# Patient Record
Sex: Male | Born: 1940
Health system: Southern US, Community
[De-identification: ages and names within clinical notes are randomized; demographics above are authoritative.]

## PROBLEM LIST (undated history)

## (undated) DIAGNOSIS — K8020 Calculus of gallbladder without cholecystitis without obstruction: Secondary | ICD-10-CM

## (undated) DIAGNOSIS — I951 Orthostatic hypotension: Secondary | ICD-10-CM

## (undated) DIAGNOSIS — R002 Palpitations: Secondary | ICD-10-CM

## (undated) DIAGNOSIS — I4891 Unspecified atrial fibrillation: Secondary | ICD-10-CM

## (undated) DIAGNOSIS — N400 Enlarged prostate without lower urinary tract symptoms: Secondary | ICD-10-CM

## (undated) HISTORY — PX: ROTATOR CUFF REPAIR: SHX139

## (undated) HISTORY — DX: Palpitations: R00.2

## (undated) HISTORY — DX: Benign prostatic hyperplasia without lower urinary tract symptoms: N40.0

## (undated) HISTORY — DX: Unspecified atrial fibrillation: I48.91

## (undated) HISTORY — PX: HEMORROIDECTOMY: SUR656

## (undated) HISTORY — DX: Calculus of gallbladder without cholecystitis without obstruction: K80.20

## (undated) HISTORY — DX: Orthostatic hypotension: I95.1

---

## 2016-05-20 ENCOUNTER — Ambulatory Visit (INDEPENDENT_AMBULATORY_CARE_PROVIDER_SITE_OTHER): Payer: Medicare Other | Admitting: Sports Medicine

## 2016-05-20 ENCOUNTER — Encounter: Payer: Self-pay | Admitting: Sports Medicine

## 2016-05-20 ENCOUNTER — Encounter (INDEPENDENT_AMBULATORY_CARE_PROVIDER_SITE_OTHER): Payer: Self-pay

## 2016-05-20 VITALS — BP 128/73 | HR 55 | Ht 75.0 in | Wt 180.0 lb

## 2016-05-20 DIAGNOSIS — L603 Nail dystrophy: Secondary | ICD-10-CM | POA: Diagnosis not present

## 2016-05-20 DIAGNOSIS — M79675 Pain in left toe(s): Secondary | ICD-10-CM

## 2016-05-20 NOTE — Progress Notes (Signed)
Subjective: Joel Pope is a 75 y.o. male patient presents to office today complaining of nail changes at left first toe some time ago. He was wearing a bad pair of shoes and noticed that around the nail started to become red. States that a while back saw discharge, but this is no longer an issue. However, wanted to have his nail checked. Denies any current pain. Patient denies fever/chills/nausea/vomitting/any other related constitutional symptoms at this time.  There are no active problems to display for this patient.   No current outpatient prescriptions on file prior to visit.   No current facility-administered medications on file prior to visit.     No Known Allergies  Objective:  Vitals:   05/20/16 1007  Weight: 180 lb (81.6 kg)  Height: 6\' 3"  (1.905 m)    General: Well developed, nourished, in no acute distress, alert and oriented x3   Dermatology: Skin is warm, dry and supple bilateral. Left hallux nail has no acute ingrowing distally well attached possible proximal lysis, however, no acute signs of infection. (-) Erythema. (-) Edema. (-) serosanguous  drainage present. The remaining nails appear unremarkable at this time. There are no open sores, lesions or other signs of infection present.  Vascular: Dorsalis Pedis artery and Posterior Tibial artery pedal pulses are 1/4 bilateral with immedate capillary fill time. Scant hair growth present. Varicosities bilateral. No lower extremity edema.   Neruologic: Grossly intact via light touch bilateral.  Musculoskeletal: No tenderness to palpation of the left hallux nail bed or nail fold(s). Muscular strength within normal limits in all groups bilateral.   Assesement and Plan: Problem List Items Addressed This Visit    None    Visit Diagnoses    Nail dystrophy    -  Primary   Toe pain, left          -Discussed treatment alternatives and plan of care; ExplainedTreatment options including permanent/temporary nail avulsion  and post procedure course to patient. -Patient declined nail procedure at this time and states would like to closely monitor the nail since it does not bother him -Advised patient to soak as needed with Epsom salt and apply Neosporin around the nail fold or cuticle if he notices change, redness or signs of infection and return to office immediately -Recommend good supportive shoes with an toe box to prevent irritation to left hallux -Dispensed silicone toe protector to use as needed at Left hallux -Patient is to return as needed or sooner if problems arise.  Landis Martins, DPM

## 2016-05-20 NOTE — Patient Instructions (Addendum)
Epsom salt soaks as needed  Neosporin around cuticle if starts to change or get infected return to office

## 2016-10-04 DIAGNOSIS — R972 Elevated prostate specific antigen [PSA]: Secondary | ICD-10-CM | POA: Diagnosis not present

## 2016-10-26 DIAGNOSIS — Z125 Encounter for screening for malignant neoplasm of prostate: Secondary | ICD-10-CM | POA: Diagnosis not present

## 2016-10-26 DIAGNOSIS — R3912 Poor urinary stream: Secondary | ICD-10-CM | POA: Diagnosis not present

## 2017-03-07 DIAGNOSIS — Z6824 Body mass index (BMI) 24.0-24.9, adult: Secondary | ICD-10-CM | POA: Diagnosis not present

## 2017-03-07 DIAGNOSIS — L255 Unspecified contact dermatitis due to plants, except food: Secondary | ICD-10-CM | POA: Diagnosis not present

## 2017-05-04 DIAGNOSIS — M720 Palmar fascial fibromatosis [Dupuytren]: Secondary | ICD-10-CM | POA: Diagnosis not present

## 2017-05-09 ENCOUNTER — Encounter: Payer: Self-pay | Admitting: Cardiology

## 2017-05-09 ENCOUNTER — Ambulatory Visit (INDEPENDENT_AMBULATORY_CARE_PROVIDER_SITE_OTHER): Payer: PPO | Admitting: Cardiology

## 2017-05-09 DIAGNOSIS — R002 Palpitations: Secondary | ICD-10-CM | POA: Diagnosis not present

## 2017-05-09 DIAGNOSIS — N401 Enlarged prostate with lower urinary tract symptoms: Secondary | ICD-10-CM

## 2017-05-09 DIAGNOSIS — R350 Frequency of micturition: Secondary | ICD-10-CM | POA: Diagnosis not present

## 2017-05-09 DIAGNOSIS — I951 Orthostatic hypotension: Secondary | ICD-10-CM | POA: Diagnosis not present

## 2017-05-09 DIAGNOSIS — N400 Enlarged prostate without lower urinary tract symptoms: Secondary | ICD-10-CM

## 2017-05-09 HISTORY — DX: Benign prostatic hyperplasia without lower urinary tract symptoms: N40.0

## 2017-05-09 HISTORY — DX: Orthostatic hypotension: I95.1

## 2017-05-09 HISTORY — DX: Palpitations: R00.2

## 2017-05-09 NOTE — Patient Instructions (Addendum)
Medication Instructions:  Your physician recommends that you continue on your current medications as directed. Please refer to the Current Medication list given to you today.  Labwork: None ordered  Testing/Procedures: Your physician has requested that you have an echocardiogram. Echocardiography is a painless test that uses sound waves to create images of your heart. It provides your doctor with information about the size and shape of your heart and how well your heart's chambers and valves are working. This procedure takes approximately one hour. There are no restrictions for this procedure.  We will contact you with your appointment at [facility]  EKG today in office  Follow-Up: Your physician recommends that you schedule a follow-up appointment in: 3 month with Dr. Agustin Cree   Any Other Special Instructions Will Be Listed Below (If Applicable).     If you need a refill on your cardiac medications before your next appointment, please call your pharmacy.

## 2017-05-09 NOTE — Progress Notes (Signed)
Cardiology Consultation:    Date:  05/09/2017   ID:  Joel Pope, DOB [birth year], MRN [id number]  PCP:  Ronita Hipps, MD  Cardiologist:  Jenne Campus, MD   Referring MD: Ronita Hipps, MD   Chief Complaint  Patient presents with  . Follow-up    feeling "faint" when standing up x 7 to 10 days  . Dizziness  Dizziness  History of Present Illness:    Joel Pope is a 76 y.o. male who is being seen today for the evaluation of Orthostatic hypotension at the request of Ronita Hipps, MD. Patient does have already established diagnosis of orthostatic hypotension. He said when he gets up very quickly he will get dizzy. He never full now, there is no history of syncope. He's been managing this problem with staying well-hydrated as well as taking salt tablets every other day. He said when he takes one get her weight every day and still tablet every other day he does not have any problems. Recently he slacked off with drinking Gatorade and that led to do some trouble. Also described to have some palpitations rarely those hyperactivity at evening time when he's tried to relax. He usually stretch and it helps. He goes to gym on the radial basis. He works in a Production designer, theatre/television/film. Overall abdomen meet he is in very good shape for his age.  History reviewed. No pertinent past medical history.  Past Surgical History:  Procedure Laterality Date  . HEMORROIDECTOMY    . ROTATOR CUFF REPAIR      Current Medications: Current Meds  Medication Sig  . aspirin EC 81 MG tablet Take 81 mg by mouth daily.  . Glucosamine-Chondroit-Vit C-Mn (GLUCOSAMINE CHONDR 1500 COMPLX PO) Take 1 tablet by mouth every other day.  Marland Kitchen omeprazole (PRILOSEC) 40 MG capsule TAKE ONE CAPSULE BY MOUTH DAILY MUST HAVE APPOINTMENT  . tamsulosin (FLOMAX) 0.4 MG CAPS capsule TAKE ONE CAPSULE DAILY FOR URINATION  . triamcinolone cream (KENALOG) 0.5 % APPLY TO AFFECTED AREA 3 TIMES DAILY FOR 1 WEEK THEN AS NEEDED      Allergies:   Patient has no known allergies.   Social History   Social History  . Marital status: Married    Spouse name: N/A  . Number of children: N/A  . Years of education: N/A   Social History Main Topics  . Smoking status: Former Research scientist (life sciences)  . Smokeless tobacco: Never Used     Comment: quit 35 years ago  . Alcohol use Yes     Comment: occassional drink  . Drug use: No  . Sexual activity: Not Asked   Other Topics Concern  . None   Social History Narrative  . None     Family History: The patient's family history includes Colon cancer in his mother; Diabetes in his father; Heart Problems in his father; Liver cancer in his mother; Parkinson's disease in his sister. ROS:   Please see the history of present illness.    All 14 point review of systems negative except as described per history of present illness.  EKGs/Labs/Other Studies Reviewed:      Recent Labs: No results found for requested labs within last 8760 hours.  Recent Lipid Panel No results found for: CHOL, TRIG, HDL, CHOLHDL, VLDL, LDLCALC, LDLDIRECT  Physical Exam:    VS:  BP 134/84 (BP Location: Right Arm, Patient Position: Sitting)   Pulse 71   Ht 6\' 4"  (1.93 m)   Wt 184 lb 12.8 oz (83.8  kg)   SpO2 96%   BMI 22.49 kg/m     Wt Readings from Last 3 Encounters:  05/09/17 184 lb 12.8 oz (83.8 kg)  05/20/16 180 lb (81.6 kg)     GEN:  Well nourished, well developed in no acute distress HEENT: Normal NECK: No JVD; No carotid bruits LYMPHATICS: No lymphadenopathy CARDIAC: RRR, no murmurs, no rubs, no gallops RESPIRATORY:  Clear to auscultation without rales, wheezing or rhonchi  ABDOMEN: Soft, non-tender, non-distended MUSCULOSKELETAL:  No edema; No deformity  SKIN: Warm and dry NEUROLOGIC:  Alert and oriented x 3 PSYCHIATRIC:  Normal affect   ASSESSMENT:    1. Orthostatic hypotension   2. Palpitations   3. Benign prostatic hyperplasia with urinary frequency    PLAN:    In order of  problems listed above:  1. Orthostatic hypotension: Establish diagnosis previously. He understands a problem I told him to start taking Flomax at evening time rather than morning, I asked him to stay well-hydrated, continue drinking Gatorade as well as use some salt tablets. I told him there are much more that we can do about the [place] to do laced that is necessary to prevent him from having symptoms. I will ask him to have EKG done as well as echocardiogram to assess his left ventricular ejection fraction. Line 2. Palpitations: Rare, not bothering him much, he does not want to do anything about it. We'll continue monitoring. 3. BPH, on Flomax instruction as above.   Medication Adjustments/Labs and Tests Ordered: Current medicines are reviewed at length with the patient today.  Concerns regarding medicines are outlined above.  No orders of the defined types were placed in this encounter.  No orders of the defined types were placed in this encounter.   Signed, Park Liter, MD, [facility]. 05/09/2017 11:17 AM    [place]

## 2017-05-11 ENCOUNTER — Other Ambulatory Visit: Payer: Self-pay

## 2017-05-11 DIAGNOSIS — R350 Frequency of micturition: Secondary | ICD-10-CM

## 2017-05-11 DIAGNOSIS — N401 Enlarged prostate with lower urinary tract symptoms: Secondary | ICD-10-CM

## 2017-05-11 DIAGNOSIS — R002 Palpitations: Secondary | ICD-10-CM

## 2017-05-11 DIAGNOSIS — I951 Orthostatic hypotension: Secondary | ICD-10-CM

## 2017-05-17 ENCOUNTER — Telehealth: Payer: Self-pay

## 2017-05-17 DIAGNOSIS — I951 Orthostatic hypotension: Secondary | ICD-10-CM | POA: Diagnosis not present

## 2017-05-17 DIAGNOSIS — R002 Palpitations: Secondary | ICD-10-CM | POA: Diagnosis not present

## 2017-05-17 NOTE — Telephone Encounter (Signed)
Pt advised of results of echo from [facility].

## 2017-06-01 DIAGNOSIS — M720 Palmar fascial fibromatosis [Dupuytren]: Secondary | ICD-10-CM | POA: Diagnosis not present

## 2017-06-02 DIAGNOSIS — M720 Palmar fascial fibromatosis [Dupuytren]: Secondary | ICD-10-CM | POA: Diagnosis not present

## 2017-06-13 DIAGNOSIS — M720 Palmar fascial fibromatosis [Dupuytren]: Secondary | ICD-10-CM | POA: Diagnosis not present

## 2017-06-29 DIAGNOSIS — Z23 Encounter for immunization: Secondary | ICD-10-CM | POA: Diagnosis not present

## 2017-07-18 ENCOUNTER — Ambulatory Visit (INDEPENDENT_AMBULATORY_CARE_PROVIDER_SITE_OTHER): Payer: PPO | Admitting: Podiatry

## 2017-07-18 DIAGNOSIS — L603 Nail dystrophy: Secondary | ICD-10-CM | POA: Diagnosis not present

## 2017-07-18 DIAGNOSIS — L601 Onycholysis: Secondary | ICD-10-CM | POA: Diagnosis not present

## 2017-07-18 NOTE — Patient Instructions (Signed)

## 2017-07-18 NOTE — Progress Notes (Signed)
  Subjective:  Patient ID: Joel Pope, male    DOB: 01/23/1941,  MRN: [id number]  Chief Complaint  Patient presents with  . Nail Problem    Left great nail loose, red around nail bed, pt states last 2 nights nail has been very painful   76 y.o. male returns for the above complaint.  States that he saw Dr. Cannon Kettle last year for loosening of the great toenail however he declined to have the nail removed at that time.  Reports that the nail is now painful x2 nights with redness around the nail and he has noticed that the nail has become loose.  Objective:  There were no vitals filed for this visit. General AA&O x3. Normal mood and affect.  Vascular Pedal pulses palpable.  Neurologic Epicritic sensation grossly intact.  Dermatologic No open lesions. Skin normal texture and turgor.  Left great toenail with proximal erythema, pain to palpation, lysis of the proximal attachment of the nail  Orthopedic: Pain to palpation left great toenail    Assessment & Plan:  Patient was evaluated and treated and all questions answered.  Onycholysis -Nail avulsed as below. -Educated on post-op care. -Return in 2 weeks for follow-up  Procedure: Avulsion of Toenail Location: Left 1st toenail Anesthesia: Lidocaine 1% plain; 1.43mL and Marcaine 0.5% plain; 1.39mL, digital block. Skin Prep: Betadine. Dressing: Silvadene; telfa; dry, sterile, compression dressing. Technique: Following skin prep, the toe was exsanguinated and a tourniquet was secured at the base of the toe. The nail was freed and avulsed in total fashion. Nail bed was inspected to check for residual nail. The area was irrigated, the tourniquet was then removed and sterile dressing applied. Disposition: Patient tolerated procedure well. Patient to return in 2 weeks for follow-up.   Return in about 2 weeks (around 08/01/2017).

## 2017-07-21 DIAGNOSIS — Z79899 Other long term (current) drug therapy: Secondary | ICD-10-CM | POA: Diagnosis not present

## 2017-07-21 DIAGNOSIS — M1990 Unspecified osteoarthritis, unspecified site: Secondary | ICD-10-CM | POA: Diagnosis not present

## 2017-07-21 DIAGNOSIS — Z1339 Encounter for screening examination for other mental health and behavioral disorders: Secondary | ICD-10-CM | POA: Diagnosis not present

## 2017-07-21 DIAGNOSIS — Z5181 Encounter for therapeutic drug level monitoring: Secondary | ICD-10-CM | POA: Diagnosis not present

## 2017-07-21 DIAGNOSIS — Z0000 Encounter for general adult medical examination without abnormal findings: Secondary | ICD-10-CM | POA: Diagnosis not present

## 2017-07-21 DIAGNOSIS — K219 Gastro-esophageal reflux disease without esophagitis: Secondary | ICD-10-CM | POA: Diagnosis not present

## 2017-07-21 DIAGNOSIS — Z6824 Body mass index (BMI) 24.0-24.9, adult: Secondary | ICD-10-CM | POA: Diagnosis not present

## 2017-08-01 ENCOUNTER — Ambulatory Visit (INDEPENDENT_AMBULATORY_CARE_PROVIDER_SITE_OTHER): Payer: PPO | Admitting: Podiatry

## 2017-08-01 DIAGNOSIS — L603 Nail dystrophy: Secondary | ICD-10-CM | POA: Diagnosis not present

## 2017-08-01 DIAGNOSIS — M79675 Pain in left toe(s): Secondary | ICD-10-CM | POA: Diagnosis not present

## 2017-08-01 NOTE — Progress Notes (Signed)
  Subjective:  Patient ID: Joel Pope, male    DOB: 1941/01/31,  MRN: [id number]  Chief Complaint  Patient presents with  . Nail Problem    nail check left 1st doing good    76 y.o. male returns for the above complaint.  States he had some burning while trying to soak the toe in Epsom salt.  Reports some soreness to the toe but no new issues.  Objective:   General AA&O x3. Normal mood and affect.  Vascular Foot warm and well perfused with good capillary refill.  Neurologic Sensation grossly intact.  Dermatologic Nail avulsion site healing well without drainage or erythema. Nail bed with overlying soft crust. Left intact. No signs of local infection.  Orthopedic: No tenderness to palpation of the toe.   Assessment & Plan:  Patient was evaluated and treated and all questions answered.  S/p Toenail Avulsion, L -Healing well without issue. -Discussed return precautions. -F/u PRN

## 2017-08-09 ENCOUNTER — Ambulatory Visit: Payer: PPO | Admitting: Cardiology

## 2017-09-02 DIAGNOSIS — M5412 Radiculopathy, cervical region: Secondary | ICD-10-CM | POA: Diagnosis not present

## 2017-09-23 DIAGNOSIS — M5412 Radiculopathy, cervical region: Secondary | ICD-10-CM | POA: Diagnosis not present

## 2017-12-08 DIAGNOSIS — Z9181 History of falling: Secondary | ICD-10-CM | POA: Diagnosis not present

## 2017-12-08 DIAGNOSIS — R1011 Right upper quadrant pain: Secondary | ICD-10-CM | POA: Diagnosis not present

## 2017-12-08 DIAGNOSIS — Z6824 Body mass index (BMI) 24.0-24.9, adult: Secondary | ICD-10-CM | POA: Diagnosis not present

## 2017-12-08 DIAGNOSIS — Z1331 Encounter for screening for depression: Secondary | ICD-10-CM | POA: Diagnosis not present

## 2017-12-08 DIAGNOSIS — Z1339 Encounter for screening examination for other mental health and behavioral disorders: Secondary | ICD-10-CM | POA: Diagnosis not present

## 2017-12-15 DIAGNOSIS — K8020 Calculus of gallbladder without cholecystitis without obstruction: Secondary | ICD-10-CM | POA: Diagnosis not present

## 2017-12-15 DIAGNOSIS — R1011 Right upper quadrant pain: Secondary | ICD-10-CM | POA: Diagnosis not present

## 2017-12-29 DIAGNOSIS — K8020 Calculus of gallbladder without cholecystitis without obstruction: Secondary | ICD-10-CM

## 2017-12-29 HISTORY — DX: Calculus of gallbladder without cholecystitis without obstruction: K80.20

## 2018-03-15 ENCOUNTER — Telehealth: Payer: Self-pay | Admitting: Cardiology

## 2018-03-15 DIAGNOSIS — Z6824 Body mass index (BMI) 24.0-24.9, adult: Secondary | ICD-10-CM | POA: Diagnosis not present

## 2018-03-15 DIAGNOSIS — L255 Unspecified contact dermatitis due to plants, except food: Secondary | ICD-10-CM | POA: Diagnosis not present

## 2018-03-15 NOTE — Telephone Encounter (Signed)
Patient c/o Palpitations:  High priority if patient c/o lightheadedness, shortness of breath, or chest pain  1) How long have you had palpitations/irregular HR/ Afib? Are you having the symptoms now? Over the past 2 weeks   2) Are you currently experiencing lightheadedness, SOB or CP? When he stands up he is getting dizzy  3) Do you have a history of afib (atrial fibrillation) or irregular heart rhythm? Pulse had been high  4) Have you checked your BP or HR? (document readings if available): yes and his BP this am was 100 on top.. Not sure of bottom and pulse was 68  5) Are you experiencing any other symptoms? Dizziness,irr heartrate/LBW

## 2018-03-15 NOTE — Telephone Encounter (Signed)
Patient was set to be seen last November and cancelled. Will consult with Dr. Agustin Cree to see if we can get patient in soon.

## 2018-03-17 NOTE — Telephone Encounter (Signed)
Joel Pope can we double book per [facility] request to get this patient in?

## 2018-03-21 ENCOUNTER — Encounter: Payer: Self-pay | Admitting: Cardiology

## 2018-03-21 ENCOUNTER — Ambulatory Visit (INDEPENDENT_AMBULATORY_CARE_PROVIDER_SITE_OTHER): Payer: PPO | Admitting: Cardiology

## 2018-03-21 VITALS — BP 146/82 | HR 71 | Ht 76.0 in | Wt 186.0 lb

## 2018-03-21 DIAGNOSIS — I951 Orthostatic hypotension: Secondary | ICD-10-CM | POA: Diagnosis not present

## 2018-03-21 DIAGNOSIS — R002 Palpitations: Secondary | ICD-10-CM

## 2018-03-21 DIAGNOSIS — Z6824 Body mass index (BMI) 24.0-24.9, adult: Secondary | ICD-10-CM | POA: Diagnosis not present

## 2018-03-21 DIAGNOSIS — L255 Unspecified contact dermatitis due to plants, except food: Secondary | ICD-10-CM | POA: Diagnosis not present

## 2018-03-21 NOTE — Progress Notes (Signed)
Cardiology Office Note:    Date:  03/21/2018   ID:  Joel Pope, DOB 05-01-1941, MRN [id number]  PCP:  Ronita Hipps, MD  Cardiologist:  Jenne Campus, MD    Referring MD: Ronita Hipps, MD   No chief complaint on file. Had episodes of palpitations and dizziness  History of Present Illness:    Joel Pope is a 77 y.o. male with history of orthostatic hypotension and palpitations.  He has been managing quite well however about 3 weeks ago he was doing his usual exercise but he does few times a week on the elliptical he said he pushed himself heart and suddenly started feeling weak tired sweating he also check his pulse which was 160 he stopped exercising started walking around did not feel much better eventually ended up laying down and waiting for about 2015 minutes started feeling better and then he did not continue exercises that they he drove the car after that and started having the same sensation he felt his heart spitting up became sweaty after he pulled over how to lower his sit and sit over there for about 10 to 15 minutes after that he was fine.  He did not fully passed out he did not have any chest pain just felt palpitations heart spitting up and being dizzy.  Since that time he gave him some break from exercises for about 3 days and then is back to his exercise routine however admits that does lighter exercise.  History reviewed. No pertinent past medical history.  Past Surgical History:  Procedure Laterality Date  . HEMORROIDECTOMY    . ROTATOR CUFF REPAIR      Current Medications: Current Meds  Medication Sig  . aspirin EC 81 MG tablet Take 81 mg by mouth daily.  . Glucosamine-Chondroit-Vit C-Mn (GLUCOSAMINE CHONDR 1500 COMPLX PO) Take 1 tablet by mouth daily.   Marland Kitchen omeprazole (PRILOSEC) 40 MG capsule TAKE ONE CAPSULE BY MOUTH DAILY MUST HAVE APPOINTMENT  . tamsulosin (FLOMAX) 0.4 MG CAPS capsule TAKE ONE CAPSULE DAILY FOR URINATION  . triamcinolone cream  (KENALOG) 0.5 % APPLY TO AFFECTED AREA 3 TIMES DAILY FOR 1 WEEK THEN AS NEEDED     Allergies:   Patient has no known allergies.   Social History   Socioeconomic History  . Marital status: Married    Spouse name: Not on file  . Number of children: Not on file  . Years of education: Not on file  . Highest education level: Not on file  Occupational History  . Not on file  Social Needs  . Financial resource strain: Not on file  . Food insecurity:    Worry: Not on file    Inability: Not on file  . Transportation needs:    Medical: Not on file    Non-medical: Not on file  Tobacco Use  . Smoking status: Former Research scientist (life sciences)  . Smokeless tobacco: Never Used  . Tobacco comment: quit 35 years ago  Substance and Sexual Activity  . Alcohol use: Yes    Comment: occassional drink  . Drug use: No  . Sexual activity: Not on file  Lifestyle  . Physical activity:    Days per week: Not on file    Minutes per session: Not on file  . Stress: Not on file  Relationships  . Social connections:    Talks on phone: Not on file    Gets together: Not on file    Attends religious service: Not on file  Active member of club or organization: Not on file    Attends meetings of clubs or organizations: Not on file    Relationship status: Not on file  Other Topics Concern  . Not on file  Social History Narrative  . Not on file     Family History: The patient's family history includes Colon cancer in his mother; Diabetes in his father; Heart Problems in his father; Liver cancer in his mother; Parkinson's disease in his sister. ROS:   Please see the history of present illness.    All 14 point review of systems negative except as described per history of present illness  EKGs/Labs/Other Studies Reviewed:      Recent Labs: No results found for requested labs within last 8760 hours.  Recent Lipid Panel No results found for: CHOL, TRIG, HDL, CHOLHDL, VLDL, LDLCALC, LDLDIRECT  Physical Exam:     VS:  BP (!) 146/82 (BP Location: Right Arm, Patient Position: Sitting, Cuff Size: Normal)   Pulse 71   Ht 6\' 4"  (1.93 m)   Wt 186 lb (84.4 kg)   SpO2 98%   BMI 22.64 kg/m     Wt Readings from Last 3 Encounters:  03/21/18 186 lb (84.4 kg)  05/09/17 184 lb 12.8 oz (83.8 kg)  05/20/16 180 lb (81.6 kg)     GEN:  Well nourished, well developed in no acute distress HEENT: Normal NECK: No JVD; No carotid bruits LYMPHATICS: No lymphadenopathy CARDIAC: RRR, no murmurs, no rubs, no gallops RESPIRATORY:  Clear to auscultation without rales, wheezing or rhonchi  ABDOMEN: Soft, non-tender, non-distended MUSCULOSKELETAL:  No edema; No deformity  SKIN: Warm and dry LOWER EXTREMITIES: no swelling NEUROLOGIC:  Alert and oriented x 3 PSYCHIATRIC:  Normal affect   ASSESSMENT:    1. Palpitations   2. Orthostatic hypotension    PLAN:    In order of problems listed above:  1. Palpitations.  I will ask him to wear event recorder for 1 month to see if he can identify what kind of arrhythmia if any he is experiencing. 2. He does have history of orthostatic hypotension is very much aware of needs to drink plenty of fluid and she did say that he had episodes about 3 weeks ago he was well hydrated he said.  I stressed again the importance of drinking plenty of fluids especially index time of the year 1 what is very hard. 3. I will also schedule him to have a stress test to make sure there is no inducible ischemia as well as I would like to observe his blood pressure and heart rate while exercising.  We will do stress echocardiogram.  I see him in the office in a few weeks and sooner if he get a problem   Medication Adjustments/Labs and Tests Ordered: Current medicines are reviewed at length with the patient today.  Concerns regarding medicines are outlined above.  No orders of the defined types were placed in this encounter.  Medication changes: No orders of the defined types were placed in  this encounter.   Signed, Park Liter, MD, [facility] 03/21/2018 1:48 PM    [facility]

## 2018-03-21 NOTE — Patient Instructions (Signed)
Medication Instructions:  Your physician recommends that you continue on your current medications as directed. Please refer to the Current Medication list given to you today.  Labwork: None  Testing/Procedures: Your physician has requested that you have a stress echocardiogram. For further information please visit [email or web address]. Please follow instruction sheet as given.  Your physician has recommended that you wear an event monitor. Event monitors are medical devices that record the heart's electrical activity. Doctors most often [place] these monitors to diagnose arrhythmias. Arrhythmias are problems with the speed or rhythm of the heartbeat. The monitor is a small, portable device. You can wear one while you do your normal daily activities. This is usually used to diagnose what is causing palpitations/syncope (passing out).  Follow-Up: Your physician recommends that you schedule a follow-up appointment in: 6 weeks  Any Other Special Instructions Will Be Listed Below (If Applicable).     If you need a refill on your cardiac medications before your next appointment, please call your pharmacy.   [place], RN, BSN   Holter Monitoring A Holter monitor is a small device that is used to detect abnormal heart rhythms. It clips to your clothing and is connected by wires to flat, sticky disks (electrodes) that attach to your chest. It is worn continuously for 24-48 hours. Follow these instructions at home:  Wear your Holter monitor at all times, even while exercising and sleeping, for as long as directed by your health care provider.  Make sure that the Holter monitor is safely clipped to your clothing or close to your body as recommended by your health care provider.  Do not get the monitor or wires wet.  Do not put body lotion or moisturizer on your chest.  Keep your skin clean.  Keep a diary of your daily activities, such as walking and doing chores. If you feel  that your heartbeat is abnormal or that your heart is fluttering or skipping a beat: ? Record what you are doing when it happens. ? Record what time of day the symptoms occur.  Return your Holter monitor as directed by your health care provider.  Keep all follow-up visits as directed by your health care provider. This is important. Get help right away if:  You feel lightheaded or you faint.  You have trouble breathing.  You feel pain in your chest, upper arm, or jaw.  You feel sick to your stomach and your skin is pale, cool, or damp.  You heartbeat feels unusual or abnormal. This information is not intended to replace advice given to you by your health care provider. Make sure you discuss any questions you have with your health care provider. Document Released: 05/28/2004 Document Revised: 02/05/2016 Document Reviewed: 04/08/2014 Elsevier Interactive Patient Education  2018 [employer].  Cardiopulmonary Exercise Stress Test Cardiopulmonary exercise testing (CPET) is a test that checks how your heart and lungs react to exercise. This is called your exercise capacity. During this test, you will walk or run on a treadmill or pedal on a stationary bike while tests are done on your heart and lungs. You may have this test to:  See why you are short of breath.  Check for exercise intolerance.  See how your lungs work.  See how your heart works.  Check for how you are responding to a heart or lung rehabilitation program.  See if you have a heart or lung problem.  See if you are healthy enough to have surgery.  What  happens before the procedure?  Follow instructions from your doctor about what you cannot eat or drink.  Ask your doctor about changing or stopping your normal medicines. This is important if you take diabetes medicines or blood thinners.  Wear loose, comfortable clothing and shoes.  If you use an inhaler, bring it with you to the test. What happens during the  procedure?  A blood pressure cuff will be placed on your arm.  Several stick-on patches (electrodes) will be placed on your chest. They will be attached to an electrocardiogram (EKG) machine.  A clip-on monitor that measures the amount of oxygen in your blood will be placed on your finger (pulse oximeter).  A clip will be placed on your nose and a mouthpiece will be placed in your mouth. This may be held in place with a headpiece. You will breathe through the mouthpiece during the test.  You will be asked to start exercising. You will be closely watched while you exercise.  The amount of effort for your exercise will be gradually increased.  During exercise, the test will measure: ? Your heart rate. ? Your heart rhythm. ? Your oxygen blood level. ? The amount of oxygen and carbon dioxide that you breathe out.  The test will end when: ? You have finished the test. ? You have reached your maximum ability to exercise. ? You have chest or leg pain, dizziness, or shortness of breath. The procedure may vary among doctors and hospitals. What happens after the procedure?  Your blood pressure and EKG will be checked to watch how you recover from the test. This information is not intended to replace advice given to you by your health care provider. Make sure you discuss any questions you have with your health care provider. Document Released: 08/18/2009 Document Revised: 01/20/2016 Document Reviewed: 07/14/2015 Elsevier Interactive Patient Education  2018 [employer].

## 2018-04-05 ENCOUNTER — Ambulatory Visit (INDEPENDENT_AMBULATORY_CARE_PROVIDER_SITE_OTHER): Payer: PPO

## 2018-04-05 DIAGNOSIS — R002 Palpitations: Secondary | ICD-10-CM | POA: Diagnosis not present

## 2018-04-05 NOTE — Progress Notes (Signed)
Stress echocardiogram with limited exam has been performed.  [place]

## 2018-04-07 ENCOUNTER — Ambulatory Visit (INDEPENDENT_AMBULATORY_CARE_PROVIDER_SITE_OTHER): Payer: PPO

## 2018-04-07 DIAGNOSIS — R002 Palpitations: Secondary | ICD-10-CM

## 2018-05-17 ENCOUNTER — Encounter: Payer: Self-pay | Admitting: Cardiology

## 2018-05-17 DIAGNOSIS — Z79899 Other long term (current) drug therapy: Secondary | ICD-10-CM | POA: Diagnosis not present

## 2018-05-17 DIAGNOSIS — I959 Hypotension, unspecified: Secondary | ICD-10-CM | POA: Diagnosis not present

## 2018-05-17 DIAGNOSIS — Z6824 Body mass index (BMI) 24.0-24.9, adult: Secondary | ICD-10-CM | POA: Diagnosis not present

## 2018-05-17 DIAGNOSIS — I480 Paroxysmal atrial fibrillation: Secondary | ICD-10-CM | POA: Diagnosis not present

## 2018-05-23 ENCOUNTER — Ambulatory Visit (INDEPENDENT_AMBULATORY_CARE_PROVIDER_SITE_OTHER): Payer: PPO | Admitting: Cardiology

## 2018-05-23 ENCOUNTER — Encounter: Payer: Self-pay | Admitting: Cardiology

## 2018-05-23 VITALS — BP 134/68 | HR 59 | Ht 76.0 in | Wt 186.2 lb

## 2018-05-23 DIAGNOSIS — R002 Palpitations: Secondary | ICD-10-CM | POA: Diagnosis not present

## 2018-05-23 DIAGNOSIS — I951 Orthostatic hypotension: Secondary | ICD-10-CM | POA: Diagnosis not present

## 2018-05-23 DIAGNOSIS — I4891 Unspecified atrial fibrillation: Secondary | ICD-10-CM

## 2018-05-23 HISTORY — DX: Unspecified atrial fibrillation: I48.91

## 2018-05-23 NOTE — Addendum Note (Signed)
Addended by: Linna Hoff R on: 05/23/2018 04:00 PM   Modules accepted: Orders

## 2018-05-23 NOTE — Progress Notes (Signed)
Cardiology Office Note:    Date:  05/23/2018   ID:  Joel Pope, DOB 04/26/1941, MRN [id number]  PCP:  Ronita Hipps, MD  Cardiologist:  Jenne Campus, MD    Referring MD: Ronita Hipps, MD   Chief Complaint  Patient presents with  . Follow up monitor  I here to discuss results of my monitor  History of Present Illness:    Arnulfo Batson is a 77 y.o. male with history of orthostatic hypotension also some palpitations.  I put monitor on him and interestingly monitor showed long-lasting episode of atrial fibrillation with fast ventricular rate.  He felt this as palpitations he also reported a palpitations in different occasions but during that time he got some extrasystole.  He does have orthostatic hypotension which bothers him a lot he have to drink a lot of Gatorade to prevent him from having significant symptoms.  We brought him here today to our office to discuss options for this situation.  Overall he is a very energetic man is 30 but looks younger he exercised on the regular basis he did stress test and he walked more than 10 minutes on the treadmill denies having any chest pain tightness squeezing pressure burning in chest.  No past medical history on file.  Past Surgical History:  Procedure Laterality Date  . HEMORROIDECTOMY    . ROTATOR CUFF REPAIR      Current Medications: Current Meds  Medication Sig  . aspirin EC 81 MG tablet Take 81 mg by mouth daily.  . Glucosamine-Chondroit-Vit C-Mn (GLUCOSAMINE CHONDR 1500 COMPLX PO) Take 1 tablet by mouth daily.   Marland Kitchen omeprazole (PRILOSEC) 40 MG capsule TAKE ONE CAPSULE BY MOUTH DAILY MUST HAVE APPOINTMENT  . tamsulosin (FLOMAX) 0.4 MG CAPS capsule TAKE ONE CAPSULE DAILY FOR URINATION  . triamcinolone cream (KENALOG) 0.5 % APPLY TO AFFECTED AREA 3 TIMES DAILY FOR 1 WEEK THEN AS NEEDED     Allergies:   Patient has no known allergies.   Social History   Socioeconomic History  . Marital status: Married    Spouse name: Not  on file  . Number of children: Not on file  . Years of education: Not on file  . Highest education level: Not on file  Occupational History  . Not on file  Social Needs  . Financial resource strain: Not on file  . Food insecurity:    Worry: Not on file    Inability: Not on file  . Transportation needs:    Medical: Not on file    Non-medical: Not on file  Tobacco Use  . Smoking status: Former Research scientist (life sciences)  . Smokeless tobacco: Never Used  . Tobacco comment: quit 35 years ago  Substance and Sexual Activity  . Alcohol use: Yes    Comment: occassional drink  . Drug use: No  . Sexual activity: Not on file  Lifestyle  . Physical activity:    Days per week: Not on file    Minutes per session: Not on file  . Stress: Not on file  Relationships  . Social connections:    Talks on phone: Not on file    Gets together: Not on file    Attends religious service: Not on file    Active member of club or organization: Not on file    Attends meetings of clubs or organizations: Not on file    Relationship status: Not on file  Other Topics Concern  . Not on file  Social History Narrative  .  Not on file     Family History: The patient's family history includes Colon cancer in his mother; Diabetes in his father; Heart Problems in his father; Liver cancer in his mother; Parkinson's disease in his sister. ROS:   Please see the history of present illness.    All 14 point review of systems negative except as described per history of present illness  EKGs/Labs/Other Studies Reviewed:      Recent Labs: No results found for requested labs within last 8760 hours.  Recent Lipid Panel No results found for: CHOL, TRIG, HDL, CHOLHDL, VLDL, LDLCALC, LDLDIRECT  Physical Exam:    VS:  BP 134/68   Pulse (!) 59   Ht 6\' 4"  (1.93 m)   Wt 186 lb 3.2 oz (84.5 kg)   SpO2 96%   BMI 22.66 kg/m     Wt Readings from Last 3 Encounters:  05/23/18 186 lb 3.2 oz (84.5 kg)  03/21/18 186 lb (84.4 kg)    05/09/17 184 lb 12.8 oz (83.8 kg)     GEN:  Well nourished, well developed in no acute distress HEENT: Normal NECK: No JVD; No carotid bruits LYMPHATICS: No lymphadenopathy CARDIAC: RRR, no murmurs, no rubs, no gallops RESPIRATORY:  Clear to auscultation without rales, wheezing or rhonchi  ABDOMEN: Soft, non-tender, non-distended MUSCULOSKELETAL:  No edema; No deformity  SKIN: Warm and dry LOWER EXTREMITIES: no swelling NEUROLOGIC:  Alert and oriented x 3 PSYCHIATRIC:  Normal affect   ASSESSMENT:    1. Palpitations   2. Atrial fibrillation, unspecified type ([place])   3. Orthostatic hypotension    PLAN:    In order of problems listed above:  1. Palpitations.  Event recorder showed evidence of PVCs that were symptomatic I wanted to give him beta-blocker but with his significant orthostatic hypotension I am reluctant to do that he said he need to drink a lot of Gatorade to feel reasonably well if his switch from get ready to water he feels absolutely horrible. 2. Paroxysmal atrial fibrillation he chads 2 Vascor equals 2 for his age.  Therefore he need to be anticoagulated.  I get his CBC as well as complete metabolic panel will do PT/INR as well as stool for guaiac and then will initiate Eliquis 5 mill grams twice daily.  In terms of therapy for his atrial fibrillation the issue is quite problematic I will refer him to EP team for consideration of antiarrhythmic therapy.  I will schedule him to have echocardiogram to assess his left ventricular ejection fraction however recent stress test was negative therefore a medication like flecainide may be appropriate.  I am afraid to give him calcium channel blocker and beta-blocker to slow down his atrial fibrillation because of symptoms of orthostatic hypotension.  He may be even a candidate for atrial fibrillation ablation since his atrial fibrillation is very symptomatic.  He said that he recalled to have 3 maybe 4 episodes of atrial fibrillation  into lasting quite long time one that we had a chance to recorder monitor and second 1 when he was walking on the treadmill and he said he kind of hit the wall his heart rate went high and he was not able to continue exercising. 3. Orthostatic hypotension discussion as above   Medication Adjustments/Labs and Tests Ordered: Current medicines are reviewed at length with the patient today.  Concerns regarding medicines are outlined above.  Orders Placed This Encounter  Procedures  . Fecal occult blood, imunochemical  . Protime-INR ( [facility] ONLY)  .  Ambulatory referral to Cardiac Electrophysiology  . ECHOCARDIOGRAM COMPLETE   Medication changes: No orders of the defined types were placed in this encounter.   Signed, Park Liter, MD, [facility] 05/23/2018 1:12 PM    [facility]

## 2018-05-23 NOTE — Patient Instructions (Addendum)
Medication Instructions:  Your physician has recommended you make the following change in your medication: Your physician recommends that you continue on your current medications as directed. Please refer to the Current Medication list given to you today.    Labwork: Your physician recommends that you return for lab work today: Pt/inr, and stool sample.   Testing/Procedures: Your physician has requested that you have an echocardiogram. Echocardiography is a painless test that uses sound waves to create images of your heart. It provides your doctor with information about the size and shape of your heart and how well your heart's chambers and valves are working. This procedure takes approximately one hour. There are no restrictions for this procedure.    Follow-Up: Your physician recommends that you schedule a follow-up appointment in: 1 month   Any Other Special Instructions Will Be Listed Below (If Applicable).  You have been referred to Dr. Curt Bears. Their office should call you within one week.     If you need a refill on your cardiac medications before your next appointment, please call your pharmacy.  Echocardiogram An echocardiogram, or echocardiography, uses sound waves (ultrasound) to produce an image of your heart. The echocardiogram is simple, painless, obtained within a short period of time, and offers valuable information to your health care provider. The images from an echocardiogram can provide information such as:  Evidence of coronary artery disease (CAD).  Heart size.  Heart muscle function.  Heart valve function.  Aneurysm detection.  Evidence of a past heart attack.  Fluid buildup around the heart.  Heart muscle thickening.  Assess heart valve function.  Tell a health care provider about:  Any allergies you have.  All medicines you are taking, including vitamins, herbs, eye drops, creams, and over-the-counter medicines.  Any problems you or family  members have had with anesthetic medicines.  Any blood disorders you have.  Any surgeries you have had.  Any medical conditions you have.  Whether you are pregnant or may be pregnant. What happens before the procedure? No special preparation is needed. Eat and drink normally. What happens during the procedure?  In order to produce an image of your heart, gel will be applied to your chest and a wand-like tool (transducer) will be moved over your chest. The gel will help transmit the sound waves from the transducer. The sound waves will harmlessly bounce off your heart to allow the heart images to be captured in real-time motion. These images will then be recorded.  You may need an IV to receive a medicine that improves the quality of the pictures. What happens after the procedure? You may return to your normal schedule including diet, activities, and medicines, unless your health care provider tells you otherwise. This information is not intended to replace advice given to you by your health care provider. Make sure you discuss any questions you have with your health care provider. Document Released: 08/27/2000 Document Revised: 04/17/2016 Document Reviewed: 05/07/2013 Elsevier Interactive Patient Education  2017 [employer].

## 2018-05-24 LAB — PROTIME-INR
INR: 1 (ref 0.8–1.2)
Prothrombin Time: 10.1 s (ref 9.1–12.0)

## 2018-05-26 LAB — FECAL OCCULT BLOOD, IMMUNOCHEMICAL: FECAL OCCULT BLD: NEGATIVE

## 2018-05-30 ENCOUNTER — Telehealth: Payer: Self-pay | Admitting: Emergency Medicine

## 2018-05-30 MED ORDER — APIXABAN 5 MG PO TABS: 5.0000 mg | ORAL_TABLET | Freq: Two times a day (BID) | ORAL | 3 refills | Status: DC

## 2018-05-30 NOTE — Addendum Note (Signed)
Addended by: Ashok Norris on: 05/30/2018 09:03 AM   Modules accepted: Orders

## 2018-05-30 NOTE — Telephone Encounter (Signed)
Left message for patient to return call.

## 2018-05-30 NOTE — Telephone Encounter (Signed)
Patient informed of tests results and Dr. Wendy Poet recommendation to start eliquis 5 mg twice daily and to stop aspirin at this time. Patient verbally understands.

## 2018-05-31 ENCOUNTER — Other Ambulatory Visit: Payer: Self-pay

## 2018-05-31 ENCOUNTER — Ambulatory Visit (INDEPENDENT_AMBULATORY_CARE_PROVIDER_SITE_OTHER): Payer: PPO

## 2018-05-31 DIAGNOSIS — I4891 Unspecified atrial fibrillation: Secondary | ICD-10-CM | POA: Diagnosis not present

## 2018-05-31 NOTE — Progress Notes (Signed)
Complete echocardiogram has been performed.  Jimmy Kodie Kishi RDCS, RVT 

## 2018-06-07 ENCOUNTER — Ambulatory Visit: Payer: PPO | Admitting: Cardiology

## 2018-06-07 ENCOUNTER — Encounter: Payer: Self-pay | Admitting: Cardiology

## 2018-06-07 VITALS — BP 120/70 | HR 81 | Ht 76.0 in | Wt 190.0 lb

## 2018-06-07 DIAGNOSIS — I493 Ventricular premature depolarization: Secondary | ICD-10-CM

## 2018-06-07 DIAGNOSIS — I480 Paroxysmal atrial fibrillation: Secondary | ICD-10-CM

## 2018-06-07 NOTE — Progress Notes (Signed)
Electrophysiology Office Note   Date:  06/07/2018   ID:  Joel Pope, DOB 1940/12/09, MRN [id number]  PCP:  Ronita Hipps, MD  Cardiologist:  Agustin Cree Primary Electrophysiologist:  Regginald Pask Meredith Leeds, MD    No chief complaint on file.    History of Present Illness: Joel Pope is a 77 y.o. male who is being seen today for the evaluation of atrial fibrillation/PVCs at the request of Jenne Campus. Presenting today for electrophysiology evaluation.  He has a history of orthostatic hypotension and palpitations.  He wore a cardiac monitor that showed rapid atrial fibrillation as well as PVCs.  He is feeling well today.  He does have episodes where he gets weak, fatigued, and short of breath.  His initial episode occurred while he was at the gym.  His heart rate was in the 160s at the time.  Since then he has had 2 other episodes.  One lasted through the night, and the second occurred in the morning.  The one that occurred in the morning, he drank 2 Gatorade's which made him feel better and took care of the atrial fibrillation in his opinion.  He does have orthostatic hypotension and drinks quite a bit of Gatorade for that.  When he started drinking the Gatorade for his orthostasis, it greatly improved his symptoms.    Today, he denies symptoms of palpitations, chest pain, shortness of breath, orthopnea, PND, lower extremity edema, claudication, dizziness, presyncope, syncope, bleeding, or neurologic sequela. The patient is tolerating medications without difficulties.    History reviewed. No pertinent past medical history. Past Surgical History:  Procedure Laterality Date  . HEMORROIDECTOMY    . ROTATOR CUFF REPAIR       Current Outpatient Medications  Medication Sig Dispense Refill  . apixaban (ELIQUIS) 5 MG TABS tablet Take 1 tablet (5 mg total) by mouth 2 (two) times daily. 180 tablet 3  . Glucosamine-Chondroit-Vit C-Mn (GLUCOSAMINE CHONDR 1500 COMPLX PO) Take 1 tablet by  mouth daily.     Marland Kitchen omeprazole (PRILOSEC) 40 MG capsule TAKE ONE CAPSULE BY MOUTH DAILY MUST HAVE APPOINTMENT  0  . tamsulosin (FLOMAX) 0.4 MG CAPS capsule TAKE ONE CAPSULE DAILY FOR URINATION  3  . triamcinolone cream (KENALOG) 0.5 % APPLY TO AFFECTED AREA 3 TIMES DAILY FOR 1 WEEK THEN AS NEEDED  0   No current facility-administered medications for this visit.     Allergies:   Patient has no known allergies.   Social History:  The patient  reports that he has quit smoking. He has never used smokeless tobacco. He reports that he drinks alcohol. He reports that he does not use drugs.   Family History:  The patient's family history includes Colon cancer in his mother; Diabetes in his father; Heart Problems in his father; Liver cancer in his mother; Parkinson's disease in his sister.    ROS:  Please see the history of present illness.   Otherwise, review of systems is positive for none.   All other systems are reviewed and negative.    PHYSICAL EXAM: VS:  BP 120/70   Pulse 81   Ht 6\' 4"  (1.93 m)   Wt 190 lb (86.2 kg)   SpO2 97%   BMI 23.13 kg/m  , BMI Body mass index is 23.13 kg/m. GEN: Well nourished, well developed, in no acute distress  HEENT: normal  Neck: no JVD, carotid bruits, or masses Cardiac: RRR; no murmurs, rubs, or gallops,no edema  Respiratory:  clear to auscultation bilaterally,  normal work of breathing GI: soft, nontender, nondistended, + BS MS: no deformity or atrophy  Skin: warm and dry Neuro:  Strength and sensation are intact Psych: euthymic mood, full affect  EKG:  EKG is not ordered today. Personal review of the ekg ordered shows sinus rhythm, rate 60  Recent Labs: No results found for requested labs within last 8760 hours.    Lipid Panel  No results found for: CHOL, TRIG, HDL, CHOLHDL, VLDL, LDLCALC, LDLDIRECT   Wt Readings from Last 3 Encounters:  06/07/18 190 lb (86.2 kg)  05/23/18 186 lb 3.2 oz (84.5 kg)  03/21/18 186 lb (84.4 kg)       Other studies Reviewed: Additional studies/ records that were reviewed today include: TTE 05/31/18  Review of the above records today demonstrates:   - Left ventricle: The cavity size was normal. Wall thickness was   normal. Systolic function was normal. The estimated ejection   fraction was in the range of 60% to 65%. Wall motion was normal;   there were no regional wall motion abnormalities. Doppler   parameters are consistent with abnormal left ventricular   relaxation (grade 1 diastolic dysfunction). - Mitral valve: Valve area by pressure half-time: 2.2 cm^2.  30 day monitor 05/11/18 - personally reviewed Atrial arrhythmia: Multiple periods of atrial fibrillation with fast ventricular rate noted  Ventricular arrhythmia: PVCs noted also ventricle triplets.  Those were felt as palpitations  Symptoms: Palpitation weakness and fatigue usually associated with PVCs.  There was a rhythm strip recorded during the dizziness however rhythm was normal without pathological arrhythmias.  ASSESSMENT AND PLAN:  1.  Paroxysmal atrial fibrillation: Currently on Eliquis.  Does have orthostatic hypotension, but this is usually well controlled by drinking high volumes of Gatorade.  I am going to try him on flecainide, but he Shandiin Eisenbeis need rate control with this.  We Laci Frenkel put him on Toprol-XL 25 mg to take in the evenings.  This may also help with his PVC symptoms.  2.  PVCs: Monitor showed symptomatic PVCs read plan to start flecainide.    Current medicines are reviewed at length with the patient today.   The patient does not have concerns regarding his medicines.  The following changes were made today: Start flecainide, Toprol-XL  Labs/ tests ordered today include:  No orders of the defined types were placed in this encounter.  Case discussed with primary cardiology  Disposition:   FU with Kolston Lacount 3 months  Signed, Shelina Luo Meredith Leeds, MD  06/07/2018 3:11 PM     [place]  [address] [phone] (office) [phone] (fax)

## 2018-06-07 NOTE — Patient Instructions (Signed)
Medication Instructions:  Your physician has recommended you make the following change in your medication:  1. START Toprol 25 mg daily at bedtime 2. START Flecainide 100 mg twice daily -- DO NOT START THIS MEDICATION UNTIL THE NURSE INSTRUCTS YOU TO. YOU WILL START THIS MEDICATON 7-10 DAYS PRIOR TO STRESS TESTING  * If you need a refill on your cardiac medications before your next appointment, please call your pharmacy.   Labwork: None ordered  Testing/Procedures: Your physician has requested that you have an exercise tolerance test - YOU WILL START FLECAINIDE 7-10 DAYS PRIOR TO THIS TESTING. For further information please visit [email or web address]. Please also follow instruction sheet, as given.  Follow-Up: Your physician recommends that you schedule a follow-up appointment in: 3 months with Dr. Curt Bears in [facility].  *Please note that any paperwork needing to be filled out by the provider will need to be addressed at the front desk prior to seeing the provider. Please note that any FMLA, disability or other documents regarding health condition is subject to a $25.00 charge that must be received prior to completion of paperwork in the form of a money order or check.  Thank you for choosing [facility]!!   Trinidad Curet, RN [phone]  Any Other Special Instructions Will Be Listed Below (If Applicable).   Metoprolol extended-release tablets What is this medicine? METOPROLOL (me TOE proe lole) is a beta-blocker. Beta-blockers reduce the workload on the heart and help it to beat more regularly. This medicine is used to treat high blood pressure and to prevent chest pain. It is also used to after a heart attack and to prevent an additional heart attack from occurring. This medicine may be used for other purposes; ask your health care provider or pharmacist if you have questions. COMMON BRAND NAME(S): toprol, Toprol XL What should I tell my health care provider before I  take this medicine? They need to know if you have any of these conditions: -diabetes -heart or vessel disease like slow heart rate, worsening heart failure, heart block, sick sinus syndrome or Raynaud's disease -kidney disease -liver disease -lung or breathing disease, like asthma or emphysema -pheochromocytoma -thyroid disease -an unusual or allergic reaction to metoprolol, other beta-blockers, medicines, foods, dyes, or preservatives -pregnant or trying to get pregnant -breast-feeding How should I use this medicine? Take this medicine by mouth with a glass of water. Follow the directions on the prescription label. Do not crush or chew. Take this medicine with or immediately after meals. Take your doses at regular intervals. Do not take more medicine than directed. Do not stop taking this medicine suddenly. This could lead to serious heart-related effects. Talk to your pediatrician regarding the use of this medicine in children. While this drug may be prescribed for children as young as 6 years for selected conditions, precautions do apply. Overdosage: If you think you have taken too much of this medicine contact a poison control center or emergency room at once. NOTE: This medicine is only for you. Do not share this medicine with others. What if I miss a dose? If you miss a dose, take it as soon as you can. If it is almost time for your next dose, take only that dose. Do not take double or extra doses. What may interact with this medicine? This medicine may interact with the following medications: -certain medicines for blood pressure, heart disease, irregular heart beat -certain medicines for depression, like monoamine oxidase (MAO) inhibitors, fluoxetine, or paroxetine -clonidine -dobutamine -epinephrine -  isoproterenol -reserpine This list may not describe all possible interactions. Give your health care provider a list of all the medicines, herbs, non-prescription drugs, or dietary  supplements you use. Also tell them if you smoke, drink alcohol, or use illegal drugs. Some items may interact with your medicine. What should I watch for while using this medicine? Visit your doctor or health care professional for regular check ups. Contact your doctor right away if your symptoms worsen. Check your blood pressure and pulse rate regularly. Ask your health care professional what your blood pressure and pulse rate should be, and when you should contact them. You may get drowsy or dizzy. Do not drive, use machinery, or do anything that needs mental alertness until you know how this medicine affects you. Do not sit or stand up quickly, especially if you are an older patient. This reduces the risk of dizzy or fainting spells. Contact your doctor if these symptoms continue. Alcohol may interfere with the effect of this medicine. Avoid alcoholic drinks. What side effects may I notice from receiving this medicine? Side effects that you should report to your doctor or health care professional as soon as possible: -allergic reactions like skin rash, itching or hives -cold or numb hands or feet -depression -difficulty breathing -faint -fever with sore throat -irregular heartbeat, chest pain -rapid weight gain -swollen legs or ankles Side effects that usually do not require medical attention (report to your doctor or health care professional if they continue or are bothersome): -anxiety or nervousness -change in sex drive or performance -dry skin -headache -nightmares or trouble sleeping -short term memory loss -stomach upset or diarrhea -unusually tired This list may not describe all possible side effects. Call your doctor for medical advice about side effects. You may report side effects to FDA at 1-800-FDA-1088. Where should I keep my medicine? Keep out of the reach of children. Store at room temperature between 15 and 30 degrees C (59 and 86 degrees F). Throw away any unused  medicine after the expiration date. NOTE: This sheet is a summary. It may not cover all possible information. If you have questions about this medicine, talk to your doctor, pharmacist, or health care provider.  2018 Elsevier/Gold Standard (2013-05-04 14:41:37)

## 2018-06-08 ENCOUNTER — Telehealth: Payer: Self-pay | Admitting: Cardiology

## 2018-06-08 MED ORDER — METOPROLOL SUCCINATE ER 25 MG PO TB24: 25.0000 mg | ORAL_TABLET | Freq: Every day | ORAL | 6 refills | Status: DC

## 2018-06-08 NOTE — Telephone Encounter (Signed)
Informed Rx sent today.

## 2018-06-08 NOTE — Telephone Encounter (Signed)
Follow Up:   Pt said he saw Dr Curt Bears yesterday and he thought  Metoprolol  Was going to be called in for him.Marland Kitchen He said he might misunderstood.

## 2018-06-15 ENCOUNTER — Telehealth: Payer: Self-pay | Admitting: *Deleted

## 2018-06-15 MED ORDER — FLECAINIDE ACETATE 100 MG PO TABS: 100.0000 mg | ORAL_TABLET | Freq: Two times a day (BID) | ORAL | 3 refills | Status: DC

## 2018-06-15 NOTE — Telephone Encounter (Signed)
Pt wanting to know about the flecanide and the stress test. Please advise.

## 2018-06-15 NOTE — Telephone Encounter (Addendum)
Informed pt I was working on this.  Informed that I would discuss with the administrator for [place] office to facilitate this testing.    Flecainide Rx sent to [facility] per pt request.  Pt understands not to start this medication until GXT scheduled.  He knows to start med 7-10 days PRIOR to GXT.

## 2018-06-19 ENCOUNTER — Telehealth: Payer: Self-pay | Admitting: *Deleted

## 2018-06-19 NOTE — Telephone Encounter (Signed)
Pt phoned again today inquiring about the flecanide and stress testing at [facility]. I let him know that Trinidad Curet is working on this and to give her a couple more days to get this set up.

## 2018-06-19 NOTE — Telephone Encounter (Signed)
Informed pt I was working on this still.  He is aware we will have an answer this week.

## 2018-06-22 NOTE — Telephone Encounter (Signed)
Pt not at home. Spoke with his wife. Updated them on the progress of getting GXT scheduled in [place]. Informed her that I have spoken with [place]/[employer] features editor about this. She is aware that we should know something beginning of next week.  If not, wife & agreed to discuss a new plan to schedule GXT in [place] office. She appreciate my update and call.

## 2018-06-27 ENCOUNTER — Ambulatory Visit (INDEPENDENT_AMBULATORY_CARE_PROVIDER_SITE_OTHER): Payer: PPO | Admitting: Cardiology

## 2018-06-27 ENCOUNTER — Encounter: Payer: Self-pay | Admitting: Cardiology

## 2018-06-27 ENCOUNTER — Ambulatory Visit: Payer: PPO | Admitting: Cardiology

## 2018-06-27 VITALS — BP 118/60 | HR 55 | Ht 76.0 in | Wt 189.0 lb

## 2018-06-27 DIAGNOSIS — R002 Palpitations: Secondary | ICD-10-CM

## 2018-06-27 DIAGNOSIS — I480 Paroxysmal atrial fibrillation: Secondary | ICD-10-CM | POA: Diagnosis not present

## 2018-06-27 DIAGNOSIS — I951 Orthostatic hypotension: Secondary | ICD-10-CM

## 2018-06-27 NOTE — Patient Instructions (Signed)
Medication Instructions:  Your physician has recommended you make the following change in your medication:  START: Flecainide 100 mg twice daily   If you need a refill on your cardiac medications before your next appointment, please call your pharmacy.   Lab work: None.   If you have labs (blood work) drawn today and your tests are completely normal, you will receive your results only by: Marland Kitchen MyChart Message (if you have MyChart) OR . A paper copy in the mail If you have any lab test that is abnormal or we need to change your treatment, we will call you to review the results.  Testing/Procedures: Your physician has requested that you have an exercise tolerance test. For further information please visit [email or web address]. Please also follow instruction sheet, as given.    Follow-Up: At [facility], you and your health needs are our priority.  As part of our continuing mission to provide you with exceptional heart care, we have created designated Provider Care Teams.  These Care Teams include your primary Cardiologist (physician) and Advanced Practice Providers (APPs -  Physician Assistants and Nurse Practitioners) who all work together to provide you with the care you need, when you need it. You will need a follow up appointment in 3 months.  Please call our office 2 months in advance to schedule this appointment.  You may see No primary care provider on file. or another member of our [employer] Provider Team in [place]: Shirlee More, MD . Jyl Heinz, MD  Any Other Special Instructions Will Be Listed Below (If Applicable).  You will need to return Friday for an ekg.

## 2018-06-27 NOTE — Progress Notes (Signed)
Cardiology Office Note:    Date:  06/27/2018   ID:  Joel Pope, DOB October 21, 1940, MRN [id number]  PCP:  Ronita Hipps, MD  Cardiologist:  Jenne Campus, MD    Referring MD: Ronita Hipps, MD   Chief Complaint  Patient presents with  . Follow up on Echo  Doing well still gets some palpitations but no sustained arrhythmias  History of Present Illness:    Joel Pope is a 77 y.o. male with paroxysmal atrial fibrillation, he was recently seen by EP team he was advised to go on flecainide however he had some delay with starting this medication because of lack of ability to exercise treadmill stress test.  That seems to be clarified he will be started on flecainide as well as small dose of beta-blocker.  I will see him back quickly on Friday to repeat his EKG because of his resting bradycardia.  Then it within a week for 10 days we will do plain EKG treadmill stress test.  I told him he will need to let me know if he became dizzy or start having some passing out spells.  No past medical history on file.  Past Surgical History:  Procedure Laterality Date  . HEMORROIDECTOMY    . ROTATOR CUFF REPAIR      Current Medications: Current Meds  Medication Sig  . apixaban (ELIQUIS) 5 MG TABS tablet Take 1 tablet (5 mg total) by mouth 2 (two) times daily.  . Glucosamine-Chondroit-Vit C-Mn (GLUCOSAMINE CHONDR 1500 COMPLX PO) Take 1 tablet by mouth daily.   . metoprolol succinate (TOPROL-XL) 25 MG 24 hr tablet Take 1 tablet (25 mg total) by mouth daily. Take with or immediately following a meal.  . omeprazole (PRILOSEC) 40 MG capsule TAKE ONE CAPSULE BY MOUTH DAILY MUST HAVE APPOINTMENT  . tamsulosin (FLOMAX) 0.4 MG CAPS capsule TAKE ONE CAPSULE DAILY FOR URINATION  . triamcinolone cream (KENALOG) 0.5 % APPLY TO AFFECTED AREA 3 TIMES DAILY FOR 1 WEEK THEN AS NEEDED     Allergies:   Patient has no known allergies.   Social History   Socioeconomic History  . Marital status: Married      Spouse name: Not on file  . Number of children: Not on file  . Years of education: Not on file  . Highest education level: Not on file  Occupational History  . Not on file  Social Needs  . Financial resource strain: Not on file  . Food insecurity:    Worry: Not on file    Inability: Not on file  . Transportation needs:    Medical: Not on file    Non-medical: Not on file  Tobacco Use  . Smoking status: Former Research scientist (life sciences)  . Smokeless tobacco: Never Used  . Tobacco comment: quit 35 years ago  Substance and Sexual Activity  . Alcohol use: Yes    Comment: occassional drink  . Drug use: No  . Sexual activity: Not on file  Lifestyle  . Physical activity:    Days per week: Not on file    Minutes per session: Not on file  . Stress: Not on file  Relationships  . Social connections:    Talks on phone: Not on file    Gets together: Not on file    Attends religious service: Not on file    Active member of club or organization: Not on file    Attends meetings of clubs or organizations: Not on file    Relationship status: Not  on file  Other Topics Concern  . Not on file  Social History Narrative  . Not on file     Family History: The patient's family history includes Colon cancer in his mother; Diabetes in his father; Heart Problems in his father; Liver cancer in his mother; Parkinson's disease in his sister. ROS:   Please see the history of present illness.    All 14 point review of systems negative except as described per history of present illness  EKGs/Labs/Other Studies Reviewed:      Recent Labs: No results found for requested labs within last 8760 hours.  Recent Lipid Panel No results found for: CHOL, TRIG, HDL, CHOLHDL, VLDL, LDLCALC, LDLDIRECT  Physical Exam:    VS:  BP 118/60   Pulse (!) 55   Ht 6\' 4"  (1.93 m)   Wt 189 lb (85.7 kg)   SpO2 97%   BMI 23.01 kg/m     Wt Readings from Last 3 Encounters:  06/27/18 189 lb (85.7 kg)  06/07/18 190 lb (86.2 kg)   05/23/18 186 lb 3.2 oz (84.5 kg)     GEN:  Well nourished, well developed in no acute distress HEENT: Normal NECK: No JVD; No carotid bruits LYMPHATICS: No lymphadenopathy CARDIAC: RRR, no murmurs, no rubs, no gallops RESPIRATORY:  Clear to auscultation without rales, wheezing or rhonchi  ABDOMEN: Soft, non-tender, non-distended MUSCULOSKELETAL:  No edema; No deformity  SKIN: Warm and dry LOWER EXTREMITIES: no swelling NEUROLOGIC:  Alert and oriented x 3 PSYCHIATRIC:  Normal affect   ASSESSMENT:    1. Paroxysmal atrial fibrillation (HCC)   2. Orthostatic hypotension   3. Palpitations    PLAN:    In order of problems listed above:  1. Paroxysmal atrial fibrillation plan as outlined above.  Will initiate flecainide. 2. Orthostatic hypotension drink plenty of fluids including Gatorade doing well from that point review. 3. Palpitations related to PVCs as well as atrial fibrillation.  Look like he did not have any sustained arrhythmia recently just some extrasystole.  Again will initiate flecainide.   Medication Adjustments/Labs and Tests Ordered: Current medicines are reviewed at length with the patient today.  Concerns regarding medicines are outlined above.  No orders of the defined types were placed in this encounter.  Medication changes: No orders of the defined types were placed in this encounter.   Signed, Park Liter, MD, [facility] 06/27/2018 11:57 AM    [place]

## 2018-06-29 DIAGNOSIS — Z23 Encounter for immunization: Secondary | ICD-10-CM | POA: Diagnosis not present

## 2018-06-30 ENCOUNTER — Ambulatory Visit (INDEPENDENT_AMBULATORY_CARE_PROVIDER_SITE_OTHER): Payer: PPO | Admitting: Cardiology

## 2018-06-30 ENCOUNTER — Encounter: Payer: Self-pay | Admitting: Cardiology

## 2018-06-30 DIAGNOSIS — I480 Paroxysmal atrial fibrillation: Secondary | ICD-10-CM | POA: Diagnosis not present

## 2018-06-30 NOTE — Progress Notes (Signed)
Patient here for EKG after starting Flecainide 100 mg twice daily. Dr. Agustin Cree reviewed ekg and advised patient to continue current management and follow up as previously advised. Patient verbally understands.

## 2018-06-30 NOTE — Patient Instructions (Signed)
Medication Instructions:  Your physician recommends that you continue on your current medications as directed. Please refer to the Current Medication list given to you today.  If you need a refill on your cardiac medications before your next appointment, please call your pharmacy.   Lab work: None.  If you have labs (blood work) drawn today and your tests are completely normal, you will receive your results only by: Marland Kitchen MyChart Message (if you have MyChart) OR . A paper copy in the mail If you have any lab test that is abnormal or we need to change your treatment, we will call you to review the results.  Testing/Procedures: None.  Follow-Up  Please follow up as previously advised.   Any Other Special Instructions Will Be Listed Below (If Applicable).

## 2018-07-05 ENCOUNTER — Ambulatory Visit (INDEPENDENT_AMBULATORY_CARE_PROVIDER_SITE_OTHER): Payer: PPO

## 2018-07-05 DIAGNOSIS — I951 Orthostatic hypotension: Secondary | ICD-10-CM | POA: Diagnosis not present

## 2018-07-05 DIAGNOSIS — R002 Palpitations: Secondary | ICD-10-CM

## 2018-07-05 DIAGNOSIS — I480 Paroxysmal atrial fibrillation: Secondary | ICD-10-CM | POA: Diagnosis not present

## 2018-07-06 LAB — EXERCISE TOLERANCE TEST
CSEPEDS: 41 s
CSEPEW: 11.2 METS
CSEPPHR: 103 {beats}/min
Exercise duration (min): 9 min
MPHR: 143 {beats}/min
Percent HR: 72 %
RPE: 16
Rest HR: 52 {beats}/min

## 2018-08-23 ENCOUNTER — Encounter: Payer: Self-pay | Admitting: *Deleted

## 2018-08-24 DIAGNOSIS — R3912 Poor urinary stream: Secondary | ICD-10-CM | POA: Diagnosis not present

## 2018-08-28 ENCOUNTER — Ambulatory Visit (INDEPENDENT_AMBULATORY_CARE_PROVIDER_SITE_OTHER): Payer: PPO | Admitting: Cardiology

## 2018-08-28 ENCOUNTER — Encounter: Payer: Self-pay | Admitting: Cardiology

## 2018-08-28 VITALS — BP 124/62 | HR 52 | Ht 76.0 in | Wt 193.0 lb

## 2018-08-28 DIAGNOSIS — I493 Ventricular premature depolarization: Secondary | ICD-10-CM | POA: Diagnosis not present

## 2018-08-28 DIAGNOSIS — I480 Paroxysmal atrial fibrillation: Secondary | ICD-10-CM | POA: Diagnosis not present

## 2018-08-28 NOTE — Progress Notes (Signed)
Electrophysiology Office Note   Date:  08/28/2018   ID:  Joel Pope, DOB [birth year], MRN [id number]  PCP:  Ronita Hipps, MD  Cardiologist:  Agustin Cree Primary Electrophysiologist:  Ryn Peine Meredith Leeds, MD    No chief complaint on file.    History of Present Illness: Joel Pope is a 77 y.o. male who is being seen today for the evaluation of atrial fibrillation/PVCs at the request of Jenne Campus. Presenting today for electrophysiology evaluation.  He has a history of orthostatic hypotension and palpitations.  He wore a cardiac monitor that showed rapid atrial fibrillation as well as PVCs.  He is feeling well today.  He does have episodes where he gets weak, fatigued, and short of breath.  His initial episode occurred while he was at the gym.  His heart rate was in the 160s at the time.  Since then he has had 2 other episodes.  One lasted through the night, and the second occurred in the morning.  The one that occurred in the morning, he drank 2 Gatorade's which made him feel better and took care of the atrial fibrillation in his opinion.  He does have orthostatic hypotension and drinks quite a bit of Gatorade for that.  When he started drinking the Gatorade for his orthostasis, it greatly improved his symptoms.  Started on flecainide at due to PVCs and atrial fibrillation.  Today, denies symptoms of palpitations, chest pain, shortness of breath, orthopnea, PND, lower extremity edema, claudication, dizziness, presyncope, syncope, bleeding, or neurologic sequela. The patient is tolerating medications without difficulties.  Overall he is feeling well.  He is not had any prolonged episodes of atrial fibrillation since starting his flecainide.  He has noted no further episodes of PVCs.  He is pleased with his current therapy.   Past Medical History:  Diagnosis Date  . Atrial fibrillation ([place]) 05/23/2018  . BPH (benign prostatic hyperplasia) 05/09/2017  . Gallstones 12/29/2017  .  Orthostatic hypotension 05/09/2017  . Palpitations 05/09/2017   Past Surgical History:  Procedure Laterality Date  . HEMORROIDECTOMY    . ROTATOR CUFF REPAIR       Current Outpatient Medications  Medication Sig Dispense Refill  . apixaban (ELIQUIS) 5 MG TABS tablet Take 1 tablet (5 mg total) by mouth 2 (two) times daily. 180 tablet 3  . flecainide (TAMBOCOR) 100 MG tablet Take 1 tablet (100 mg total) by mouth 2 (two) times daily. 60 tablet 3  . Glucosamine-Chondroit-Vit C-Mn (GLUCOSAMINE CHONDR 1500 COMPLX PO) Take 1 tablet by mouth daily.     . metoprolol succinate (TOPROL-XL) 25 MG 24 hr tablet Take 1 tablet (25 mg total) by mouth daily. Take with or immediately following a meal. 30 tablet 6  . omeprazole (PRILOSEC) 40 MG capsule TAKE ONE CAPSULE BY MOUTH DAILY MUST HAVE APPOINTMENT  0  . tamsulosin (FLOMAX) 0.4 MG CAPS capsule TAKE ONE CAPSULE DAILY FOR URINATION  3  . triamcinolone cream (KENALOG) 0.5 % APPLY TO AFFECTED AREA 3 TIMES DAILY FOR 1 WEEK THEN AS NEEDED  0   No current facility-administered medications for this visit.     Allergies:   Patient has no known allergies.   Social History:  The patient  reports that he has quit smoking. He has never used smokeless tobacco. He reports current alcohol use. He reports that he does not use drugs.   Family History:  The patient's family history includes Colon cancer in his mother; Diabetes in his father; Heart Problems in his  father; Liver cancer in his mother; Parkinson's disease in his sister.    ROS:  Please see the history of present illness.   Otherwise, review of systems is positive for none.   All other systems are reviewed and negative.   PHYSICAL EXAM: VS:  BP 124/62   Pulse (!) 52   Ht 6\' 4"  (1.93 m)   Wt 193 lb (87.5 kg)   BMI 23.49 kg/m  , BMI Body mass index is 23.49 kg/m. GEN: Well nourished, well developed, in no acute distress  HEENT: normal  Neck: no JVD, carotid bruits, or masses Cardiac: RRR; no  murmurs, rubs, or gallops,no edema  Respiratory:  clear to auscultation bilaterally, normal work of breathing GI: soft, nontender, nondistended, + BS MS: no deformity or atrophy  Skin: warm and dry Neuro:  Strength and sensation are intact Psych: euthymic mood, full affect  EKG:  EKG is ordered today. Personal review of the ekg ordered shows sinus rhythm, rate 52  Recent Labs: No results found for requested labs within last 8760 hours.    Lipid Panel  No results found for: CHOL, TRIG, HDL, CHOLHDL, VLDL, LDLCALC, LDLDIRECT   Wt Readings from Last 3 Encounters:  08/28/18 193 lb (87.5 kg)  06/27/18 189 lb (85.7 kg)  06/07/18 190 lb (86.2 kg)      Other studies Reviewed: Additional studies/ records that were reviewed today include: TTE 05/31/18  Review of the above records today demonstrates:   - Left ventricle: The cavity size was normal. Wall thickness was   normal. Systolic function was normal. The estimated ejection   fraction was in the range of 60% to 65%. Wall motion was normal;   there were no regional wall motion abnormalities. Doppler   parameters are consistent with abnormal left ventricular   relaxation (grade 1 diastolic dysfunction). - Mitral valve: Valve area by pressure half-time: 2.2 cm^2.  30 day monitor 05/11/18 - personally reviewed Atrial arrhythmia: Multiple periods of atrial fibrillation with fast ventricular rate noted  Ventricular arrhythmia: PVCs noted also ventricle triplets.  Those were felt as palpitations  Symptoms: Palpitation weakness and fatigue usually associated with PVCs.  There was a rhythm strip recorded during the dizziness however rhythm was normal without pathological arrhythmias.  ASSESSMENT AND PLAN:  1.  Paroxysmal atrial fibrillation: Currently on Eliquis and flecainide.  He is feeling well without obvious episodes of atrial fibrillation.  No changes.  2.  PVCs: Has noted no further symptoms due to PVCs.  Continue  flecainide.    Current medicines are reviewed at length with the patient today.   The patient does not have concerns regarding his medicines.  The following changes were made today: None  Labs/ tests ordered today include:  Orders Placed This Encounter  Procedures  . EKG 12-Lead    Disposition:   FU with Monty Spicher 12 months  Signed, Samson Ralph Meredith Leeds, MD  08/28/2018 11:55 AM     [facility] [address] [place] [address] [phone] (office) [phone] (fax)

## 2018-08-28 NOTE — Patient Instructions (Signed)
Medication Instructions:  Your physician recommends that you continue on your current medications as directed. Please refer to the Current Medication list given to you today.  * If you need a refill on your cardiac medications before your next appointment, please call your pharmacy.   Labwork: None ordered *We will only notify you of abnormal results, otherwise continue current treatment plan.  Testing/Procedures: None ordered  Follow-Up: Your physician wants you to follow-up in: 1 year with Dr. Curt Bears.  You will receive a reminder letter in the mail two months in advance. If you don't receive a letter, please call our office to schedule the follow-up appointment.  Thank you for choosing [facility]!!   Trinidad Curet, RN [phone]  Any Other Special Instructions Will Be Listed Below (If Applicable).

## 2018-10-18 ENCOUNTER — Other Ambulatory Visit: Payer: Self-pay | Admitting: Cardiology

## 2018-10-18 NOTE — Telephone Encounter (Signed)
This is a [place] pt °

## 2018-11-17 DIAGNOSIS — D485 Neoplasm of uncertain behavior of skin: Secondary | ICD-10-CM | POA: Diagnosis not present

## 2018-11-17 DIAGNOSIS — D225 Melanocytic nevi of trunk: Secondary | ICD-10-CM | POA: Diagnosis not present

## 2018-11-17 DIAGNOSIS — L821 Other seborrheic keratosis: Secondary | ICD-10-CM | POA: Diagnosis not present

## 2018-11-17 DIAGNOSIS — L820 Inflamed seborrheic keratosis: Secondary | ICD-10-CM | POA: Diagnosis not present

## 2018-11-17 DIAGNOSIS — L570 Actinic keratosis: Secondary | ICD-10-CM | POA: Diagnosis not present

## 2019-01-03 DIAGNOSIS — Z0000 Encounter for general adult medical examination without abnormal findings: Secondary | ICD-10-CM | POA: Diagnosis not present

## 2019-01-03 DIAGNOSIS — Z6823 Body mass index (BMI) 23.0-23.9, adult: Secondary | ICD-10-CM | POA: Diagnosis not present

## 2019-01-03 DIAGNOSIS — F410 Panic disorder [episodic paroxysmal anxiety] without agoraphobia: Secondary | ICD-10-CM | POA: Diagnosis not present

## 2019-01-07 ENCOUNTER — Other Ambulatory Visit: Payer: Self-pay | Admitting: Cardiology

## 2019-01-09 ENCOUNTER — Telehealth: Payer: Self-pay | Admitting: Emergency Medicine

## 2019-01-09 ENCOUNTER — Encounter: Payer: Self-pay | Admitting: Cardiology

## 2019-01-09 ENCOUNTER — Other Ambulatory Visit: Payer: Self-pay

## 2019-01-09 ENCOUNTER — Telehealth: Payer: Self-pay | Admitting: Cardiology

## 2019-01-09 ENCOUNTER — Telehealth (INDEPENDENT_AMBULATORY_CARE_PROVIDER_SITE_OTHER): Payer: PPO | Admitting: Cardiology

## 2019-01-09 VITALS — BP 128/78 | HR 60

## 2019-01-09 DIAGNOSIS — R350 Frequency of micturition: Secondary | ICD-10-CM

## 2019-01-09 DIAGNOSIS — I480 Paroxysmal atrial fibrillation: Secondary | ICD-10-CM

## 2019-01-09 DIAGNOSIS — N401 Enlarged prostate with lower urinary tract symptoms: Secondary | ICD-10-CM

## 2019-01-09 DIAGNOSIS — R0789 Other chest pain: Secondary | ICD-10-CM

## 2019-01-09 DIAGNOSIS — R079 Chest pain, unspecified: Secondary | ICD-10-CM

## 2019-01-09 DIAGNOSIS — R002 Palpitations: Secondary | ICD-10-CM

## 2019-01-09 DIAGNOSIS — I951 Orthostatic hypotension: Secondary | ICD-10-CM

## 2019-01-09 HISTORY — DX: Other chest pain: R07.89

## 2019-01-09 NOTE — Telephone Encounter (Signed)
Patient calling in complaining of left sided chest pain that has been going on for 1 week. It is intermittent pain that is relieved by stretching. He describes his pain more as a tightness. He has been really stressed lately since his wife has been in the hospital and rehab. He thinks this could be a factor however he wants to discuss with Dr. Agustin Cree. Dr. Agustin Cree made aware and advised patient be added on today for a virtual visit.

## 2019-01-09 NOTE — Telephone Encounter (Signed)
Patient informed of appointment today at 2:00 and has agreed to consent. He will call back with information for [place] to use ipad for the visit.    YOUR CARDIOLOGY TEAM HAS ARRANGED FOR AN E-VISIT FOR YOUR APPOINTMENT - PLEASE REVIEW IMPORTANT INFORMATION BELOW SEVERAL DAYS PRIOR TO YOUR APPOINTMENT  Due to the recent COVID-19 pandemic, we are transitioning in-person office visits to tele-medicine visits in an effort to decrease unnecessary exposure to our patients, their families, and staff. These visits are billed to your insurance just like a normal visit is. We also encourage you to sign up for MyChart if you have not already done so. You will need a smartphone if possible. For patients that do not have this, we can still complete the visit using a regular telephone but do prefer a smartphone to enable video when possible. You may have a family member that lives with you that can help. If possible, we also ask that you have a blood pressure cuff and scale at home to measure your blood pressure, heart rate and weight prior to your scheduled appointment. Patients with clinical needs that need an in-person evaluation and testing will still be able to come to the office if absolutely necessary. If you have any questions, feel free to call our office.     YOUR PROVIDER WILL BE USING THE FOLLOWING PLATFORM TO COMPLETE YOUR VISIT:  . IF USING DOXIMITY or [email or web address] - The staff will give you instructions on receiving your link to join the meeting the day of your visit.      2-3 DAYS BEFORE YOUR APPOINTMENT  You will receive a telephone call from one of our [place] team members - your caller ID may say "Unknown caller." If this is a video visit, we will walk you through how to get the video launched on your phone. We will remind you check your blood pressure, heart rate and weight prior to your scheduled appointment. If you have an Apple Watch or Kardia, please upload any pertinent ECG strips the day before  or morning of your appointment to [place]. Our staff will also make sure you have reviewed the consent and agree to move forward with your scheduled tele-health visit.     THE DAY OF YOUR APPOINTMENT  Approximately 15 minutes prior to your scheduled appointment, you will receive a telephone call from one of [place] team - your caller ID may say "Unknown caller."  Our staff will confirm medications, vital signs for the day and any symptoms you may be experiencing. Please have this information available prior to the time of visit start. It may also be helpful for you to have a pad of paper and pen handy for any instructions given during your visit. They will also walk you through joining the smartphone meeting if this is a video visit.    CONSENT FOR TELE-HEALTH VISIT - PLEASE REVIEW  I hereby voluntarily request, consent and authorize [facility] and its employed or contracted physicians, physician assistants, nurse practitioners or other licensed health care professionals (the Practitioner), to provide me with telemedicine health care services (the "Services") as deemed necessary by the treating Practitioner. I acknowledge and consent to receive the Services by the Practitioner via telemedicine. I understand that the telemedicine visit will involve communicating with the Practitioner through live audiovisual communication technology and the disclosure of certain medical information by electronic transmission. I acknowledge that I have been given the opportunity to request an in-person assessment or other available alternative prior  to the telemedicine visit and am voluntarily participating in the telemedicine visit.  I understand that I have the right to withhold or withdraw my consent to the use of telemedicine in the course of my care at any time, without affecting my right to future care or treatment, and that the Practitioner or I may terminate the telemedicine visit at any time. I understand  that I have the right to inspect all information obtained and/or recorded in the course of the telemedicine visit and may receive copies of available information for a reasonable fee.  I understand that some of the potential risks of receiving the Services via telemedicine include:  Marland Kitchen Delay or interruption in medical evaluation due to technological equipment failure or disruption; . Information transmitted may not be sufficient (e.g. poor resolution of images) to allow for appropriate medical decision making by the Practitioner; and/or  . In rare instances, security protocols could fail, causing a breach of personal health information.  Furthermore, I acknowledge that it is my responsibility to provide information about my medical history, conditions and care that is complete and accurate to the best of my ability. I acknowledge that Practitioner's advice, recommendations, and/or decision may be based on factors not within their control, such as incomplete or inaccurate data provided by me or distortions of diagnostic images or specimens that may result from electronic transmissions. I understand that the practice of medicine is not an exact science and that Practitioner makes no warranties or guarantees regarding treatment outcomes. I acknowledge that I will receive a copy of this consent concurrently upon execution via email to the email address I last provided but may also request a printed copy by calling the office of [place].    I understand that my insurance will be billed for this visit.   I have read or had this consent read to me. . I understand the contents of this consent, which adequately explains the benefits and risks of the Services being provided via telemedicine.  . I have been provided ample opportunity to ask questions regarding this consent and the Services and have had my questions answered to my satisfaction. . I give my informed consent for the services to be provided  through the use of telemedicine in my medical care  By participating in this telemedicine visit I agree to the above.

## 2019-01-09 NOTE — Progress Notes (Signed)
Virtual Visit via Video Note   This visit type was conducted due to national recommendations for restrictions regarding the COVID-19 Pandemic (e.g. social distancing) in an effort to limit this patient's exposure and mitigate transmission in our community.  Due to his co-morbid illnesses, this patient is at least at moderate risk for complications without adequate follow up.  This format is felt to be most appropriate for this patient at this time.  All issues noted in this document were discussed and addressed.  A limited physical exam was performed with this format.  Please refer to the patient's chart for his consent to telehealth for [facility].  Evaluation Performed:  Follow-up visit  This visit type was conducted due to national recommendations for restrictions regarding the COVID-19 Pandemic (e.g. social distancing).  This format is felt to be most appropriate for this patient at this time.  All issues noted in this document were discussed and addressed.  No physical exam was performed (except for noted visual exam findings with Video Visits).  Please refer to the patient's chart (MyChart message for video visits and phone note for telephone visits) for the patient's consent to telehealth for [facility].  Date:  01/09/2019  ID: Joel Pope, DOB Feb 13, 1941, MRN [id number]   Patient Location: [place]  [address]   Provider location:   [place] Office  PCP:  Ronita Hipps, MD  Cardiologist:  Jenne Campus, MD     Chief Complaint: I have a chest pain  History of Present Illness:    Joel Pope is a 78 y.o. male  who presents via audio/video conferencing for a telehealth visit today.  With paroxysmal atrial fibrillation as well as PVCs and APCs.  There seems to be controlled with flecainide which I will continue.  However new symptoms develop for last 2 weeks has been experiencing chest pain.  He described pain as located retrosternally  without radiation can last for few hours straight provoking factors are emotional stress.  His wife who is my patient as well is very sick and she ended going to the hospital and then to nursing home he was very stressed out about the situation and developed chest pain at that time.  Interestingly he exercised on the regular basis walking for at least half an hour he has no difficulty doing it.  He also told me when he got the pain he sits in the chair he stretch and that helps.  There is no shortness of breath no sweating associated with it   The patient does not have symptoms concerning for COVID-19 infection (fever, chills, cough, or new SHORTNESS OF BREATH).    Prior CV studies:   The following studies were reviewed today:       Past Medical History:  Diagnosis Date  . Atrial fibrillation ([place]) 05/23/2018  . BPH (benign prostatic hyperplasia) 05/09/2017  . Gallstones 12/29/2017  . Orthostatic hypotension 05/09/2017  . Palpitations 05/09/2017    Past Surgical History:  Procedure Laterality Date  . HEMORROIDECTOMY    . ROTATOR CUFF REPAIR       Current Meds  Medication Sig  . apixaban (ELIQUIS) 5 MG TABS tablet Take 1 tablet (5 mg total) by mouth 2 (two) times daily.  . flecainide (TAMBOCOR) 100 MG tablet TAKE ONE TABLET BY MOUTH TWICE DAILY  . Glucosamine-Chondroit-Vit C-Mn (GLUCOSAMINE CHONDR 1500 COMPLX PO) Take 1 tablet by mouth daily.   . metoprolol succinate (TOPROL-XL) 25 MG 24 hr tablet  Take 1 tablet (25 mg total) by mouth daily. Take with or immediately following a meal.  . omeprazole (PRILOSEC) 40 MG capsule TAKE ONE CAPSULE BY MOUTH DAILY MUST HAVE APPOINTMENT  . tamsulosin (FLOMAX) 0.4 MG CAPS capsule TAKE ONE CAPSULE DAILY FOR URINATION  . triamcinolone cream (KENALOG) 0.5 % APPLY TO AFFECTED AREA 3 TIMES DAILY FOR 1 WEEK THEN AS NEEDED      Family History: The patient's family history includes Colon cancer in his mother; Diabetes in his father; Heart Problems in  his father; Liver cancer in his mother; Parkinson's disease in his sister.   ROS:   Please see the history of present illness.     All other systems reviewed and are negative.   Labs/Other Tests and Data Reviewed:     Recent Labs: No results found for requested labs within last 8760 hours.  Recent Lipid Panel No results found for: CHOL, TRIG, HDL, CHOLHDL, VLDL, LDLCALC, LDLDIRECT    Exam:    Vital Signs:  BP 128/78   Pulse 60   SpO2 98%     Wt Readings from Last 3 Encounters:  08/28/18 193 lb (87.5 kg)  06/27/18 189 lb (85.7 kg)  06/07/18 190 lb (86.2 kg)     Well nourished, well developed in no acute distress. Alert awake oriented x3.  With talking the video link.  Not in any distress he actually allowed me to talk to his wife as well.  Diagnosis for this visit:   1. Chest pain of uncertain etiology   2. Orthostatic hypotension   3. Paroxysmal atrial fibrillation (HCC)   4. Palpitations   5. Benign prostatic hyperplasia with urinary frequency   6. Atypical chest pain      ASSESSMENT & PLAN:    1.  Chest pain which is unclear etiology right now.  Lasting for many hours provoked by emotional stress.  I will ask him to come back tomorrow to my office so we will do EKG.  He will also have troponin I done.  Also told him if he have episode of chest pain to take extra half of tablet of metoprolol.  He did have a stress test done in October of last year which was negative.  Overall he does have good exercise tolerance and no symptoms while exercising. 2.  Orthostatic hypotension managed with fluid intake. 3.  Proximal atrial fibrillation flecainide and anticoagulation which I will continue. 4.  Palpitations denies having any.  COVID-19 Education: The signs and symptoms of COVID-19 were discussed with the patient and how to seek care for testing (follow up with PCP or arrange E-visit).  The importance of social distancing was discussed today.  Patient Risk:   After  full review of this patients clinical status, I feel that they are at least moderate risk at this time.  Time:   Today, I have spent 17 minutes with the patient with telehealth technology discussing pt health issues.  I spent 5 minutes reviewing her chart before the visit.  Visit was finished at 2:17 PM.    Medication Adjustments/Labs and Tests Ordered: Current medicines are reviewed at length with the patient today.  Concerns regarding medicines are outlined above.  Orders Placed This Encounter  Procedures  . Troponin I  . EKG 12-Lead   Medication changes: No orders of the defined types were placed in this encounter.    Disposition: Tomorrow EKG as well as troponin.  We will see him back in 1 month  Signed,  Park Liter, MD, [facility] 01/09/2019 2:25 PM    [facility]

## 2019-01-09 NOTE — Telephone Encounter (Signed)
error 

## 2019-01-09 NOTE — Patient Instructions (Signed)
Medication Instructions:  Your physician recommends that you continue on your current medications as directed. Please refer to the Current Medication list given to you today.  If you need a refill on your cardiac medications before your next appointment, please call your pharmacy.   Lab work: Your physician recommends that you return for lab work in: 01/10/19 Troponin I  If you have labs (blood work) drawn today and your tests are completely normal, you will receive your results only by: Marland Kitchen MyChart Message (if you have MyChart) OR . A paper copy in the mail If you have any lab test that is abnormal or we need to change your treatment, we will call you to review the results.  Testing/Procedures: EKG 01/10/19  Follow-Up: At [facility], you and your health needs are our priority.  As part of our continuing mission to provide you with exceptional heart care, we have created designated Provider Care Teams.  These Care Teams include your primary Cardiologist (physician) and Advanced Practice Providers (APPs -  Physician Assistants and Nurse Practitioners) who all work together to provide you with the care you need, when you need it. You will need a follow up appointment in 1 months.  Any Other Special Instructions Will Be Listed Below (If Applicable).

## 2019-01-10 ENCOUNTER — Ambulatory Visit (INDEPENDENT_AMBULATORY_CARE_PROVIDER_SITE_OTHER): Payer: PPO | Admitting: Cardiology

## 2019-01-10 ENCOUNTER — Encounter: Payer: Self-pay | Admitting: Cardiology

## 2019-01-10 VITALS — BP 130/68 | HR 54 | Wt 189.0 lb

## 2019-01-10 DIAGNOSIS — R002 Palpitations: Secondary | ICD-10-CM

## 2019-01-10 DIAGNOSIS — I480 Paroxysmal atrial fibrillation: Secondary | ICD-10-CM

## 2019-01-10 DIAGNOSIS — R0789 Other chest pain: Secondary | ICD-10-CM

## 2019-01-10 NOTE — Progress Notes (Signed)
Cardiology Office Note:    Date:  01/10/2019   ID:  Joel Pope, DOB 1941-09-04, MRN [id number]  PCP:  Joel Hipps, MD  Cardiologist:  Jenne Campus, MD    Referring MD: Joel Hipps, MD   Chief Complaint  Patient presents with  . Here for EKG  He came to the office for EKG.  History of Present Illness:    Joel Pope is a 78 y.o. male paroxysmal atrial fibrillation, orthostatic hypotension started complaining of having atypical chest pain related typical to emotions.  Also when he exercise he described to have some fatigue and tiredness but no chest pain tightness squeezing pressure burning chest.  His EKG today showed normal sinus rhythm actually sinus bradycardia 54 bpm normal P interval there is minimal shift in his axis.  There is a S wave in aVF previously there was a very small S wave.  Past Medical History:  Diagnosis Date  . Atrial fibrillation ([place]) 05/23/2018  . BPH (benign prostatic hyperplasia) 05/09/2017  . Gallstones 12/29/2017  . Orthostatic hypotension 05/09/2017  . Palpitations 05/09/2017    Past Surgical History:  Procedure Laterality Date  . HEMORROIDECTOMY    . ROTATOR CUFF REPAIR      Current Medications: Current Meds  Medication Sig  . apixaban (ELIQUIS) 5 MG TABS tablet Take 1 tablet (5 mg total) by mouth 2 (two) times daily.  . flecainide (TAMBOCOR) 100 MG tablet TAKE ONE TABLET BY MOUTH TWICE DAILY  . Glucosamine-Chondroit-Vit C-Mn (GLUCOSAMINE CHONDR 1500 COMPLX PO) Take 1 tablet by mouth daily.   . metoprolol succinate (TOPROL-XL) 25 MG 24 hr tablet Take 1 tablet (25 mg total) by mouth daily. Take with or immediately following a meal.  . omeprazole (PRILOSEC) 40 MG capsule TAKE ONE CAPSULE BY MOUTH DAILY MUST HAVE APPOINTMENT  . tamsulosin (FLOMAX) 0.4 MG CAPS capsule TAKE ONE CAPSULE DAILY FOR URINATION  . triamcinolone cream (KENALOG) 0.5 % APPLY TO AFFECTED AREA 3 TIMES DAILY FOR 1 WEEK THEN AS NEEDED     Allergies:   Patient has  no known allergies.   Social History   Socioeconomic History  . Marital status: Married    Spouse name: Not on file  . Number of children: Not on file  . Years of education: Not on file  . Highest education level: Not on file  Occupational History  . Not on file  Social Needs  . Financial resource strain: Not on file  . Food insecurity:    Worry: Not on file    Inability: Not on file  . Transportation needs:    Medical: Not on file    Non-medical: Not on file  Tobacco Use  . Smoking status: Former Research scientist (life sciences)  . Smokeless tobacco: Never Used  . Tobacco comment: quit 35 years ago  Substance and Sexual Activity  . Alcohol use: Yes    Comment: occassional drink  . Drug use: No  . Sexual activity: Not on file  Lifestyle  . Physical activity:    Days per week: Not on file    Minutes per session: Not on file  . Stress: Not on file  Relationships  . Social connections:    Talks on phone: Not on file    Gets together: Not on file    Attends religious service: Not on file    Active member of club or organization: Not on file    Attends meetings of clubs or organizations: Not on file    Relationship status:  Not on file  Other Topics Concern  . Not on file  Social History Narrative  . Not on file     Family History: The patient's family history includes Colon cancer in his mother; Diabetes in his father; Heart Problems in his father; Liver cancer in his mother; Parkinson's disease in his sister. ROS:   Please see the history of present illness.    All 14 point review of systems negative except as described per history of present illness  EKGs/Labs/Other Studies Reviewed:      Recent Labs: No results found for requested labs within last 8760 hours.  Recent Lipid Panel No results found for: CHOL, TRIG, HDL, CHOLHDL, VLDL, LDLCALC, LDLDIRECT  Physical Exam:    VS:  BP 130/68   Pulse (!) 54   Wt 189 lb (85.7 kg)   BMI 23.01 kg/m     Wt Readings from Last 3  Encounters:  01/10/19 189 lb (85.7 kg)  08/28/18 193 lb (87.5 kg)  06/27/18 189 lb (85.7 kg)     GEN:  Well nourished, well developed in no acute distress HEENT: Normal NECK: No JVD; No carotid bruits LYMPHATICS: No lymphadenopathy CARDIAC: RRR, no murmurs, no rubs, no gallops RESPIRATORY:  Clear to auscultation without rales, wheezing or rhonchi  ABDOMEN: Soft, non-tender, non-distended MUSCULOSKELETAL:  No edema; No deformity  SKIN: Warm and dry LOWER EXTREMITIES: no swelling NEUROLOGIC:  Alert and oriented x 3 PSYCHIATRIC:  Normal affect   ASSESSMENT:    1. Atypical chest pain   2. Paroxysmal atrial fibrillation (HCC) Chronic  3. Palpitations   4. Paroxysmal atrial fibrillation (HCC)    PLAN:    In order of problems listed above:  1. Atypical chest pain we will draw troponin today.  I told him to take extra metoprolol if he have some more symptoms. 2. 2.  Paroxysmal atrial fibrillation doing well from that point review. 3. Palpitations doing well.   Medication Adjustments/Labs and Tests Ordered: Current medicines are reviewed at length with the patient today.  Concerns regarding medicines are outlined above.  No orders of the defined types were placed in this encounter.  Medication changes: No orders of the defined types were placed in this encounter.   Signed, Park Liter, MD, [facility] 01/10/2019 3:10 PM    [place]

## 2019-01-11 LAB — TROPONIN I: Troponin I: <0.01 ng/mL (ref 0.00–0.04)

## 2019-02-13 ENCOUNTER — Other Ambulatory Visit: Payer: Self-pay

## 2019-02-13 ENCOUNTER — Encounter: Payer: Self-pay | Admitting: Cardiology

## 2019-02-13 ENCOUNTER — Ambulatory Visit (INDEPENDENT_AMBULATORY_CARE_PROVIDER_SITE_OTHER): Payer: PPO | Admitting: Cardiology

## 2019-02-13 VITALS — BP 110/64 | HR 50 | Ht 76.0 in | Wt 189.4 lb

## 2019-02-13 DIAGNOSIS — R002 Palpitations: Secondary | ICD-10-CM | POA: Diagnosis not present

## 2019-02-13 DIAGNOSIS — I480 Paroxysmal atrial fibrillation: Secondary | ICD-10-CM | POA: Diagnosis not present

## 2019-02-13 DIAGNOSIS — R0789 Other chest pain: Secondary | ICD-10-CM | POA: Diagnosis not present

## 2019-02-13 DIAGNOSIS — I951 Orthostatic hypotension: Secondary | ICD-10-CM

## 2019-02-13 NOTE — Patient Instructions (Signed)
Medication Instructions:  Your physician recommends that you continue on your current medications as directed. Please refer to the Current Medication list given to you today.  If you need a refill on your cardiac medications before your next appointment, please call your pharmacy.   Lab work: None.  If you have labs (blood work) drawn today and your tests are completely normal, you will receive your results only by: . MyChart Message (if you have MyChart) OR . A paper copy in the mail If you have any lab test that is abnormal or we need to change your treatment, we will call you to review the results.  Testing/Procedures: None.   Follow-Up: At [facility], you and your health needs are our priority.  As part of our continuing mission to provide you with exceptional heart care, we have created designated Provider Care Teams.  These Care Teams include your primary Cardiologist (physician) and Advanced Practice Providers (APPs -  Physician Assistants and Nurse Practitioners) who all work together to provide you with the care you need, when you need it. You will need a follow up appointment in 3 months.  Please call our office 2 months in advance to schedule this appointment.  You may see No primary care provider on file. or another member of our [facility] Provider Team in [place]: Brian Munley, MD . Rajan Revankar, MD  Any Other Special Instructions Will Be Listed Below (If Applicable).     

## 2019-02-13 NOTE — Progress Notes (Signed)
Cardiology Office Note:    Date:  02/13/2019   ID:  Joel Pope, DOB [birth year], MRN [id number]  PCP:  Ronita Hipps, MD  Cardiologist:  Jenne Campus, MD    Referring MD: Ronita Hipps, MD   Chief Complaint  Patient presents with  . 1 month follow up  Doing better  History of Present Illness:    Joel Pope is a 78 y.o. male with paroxysmal atrial fibrillation successfully suppressed with flecainide.  Anticoagulated which I will continue, orthostatic hypotension.  He presented to my office month ago with chief complaint of atypical chest pain that was not related to exercise Or emotional situation within last month he did not have any issue there is no chest pain tightness squeezing pressure burning chest overall he is doing well.  He did have some stressful situation he is wife Joel Pope who is my patient as well fall down and broke her wrist that was very stressful to him in spite of that he did not have any chest pain.  We cut down some beta-blocker because of bradycardia he is feeling better  Past Medical History:  Diagnosis Date  . Atrial fibrillation ([place]) 05/23/2018  . BPH (benign prostatic hyperplasia) 05/09/2017  . Gallstones 12/29/2017  . Orthostatic hypotension 05/09/2017  . Palpitations 05/09/2017    Past Surgical History:  Procedure Laterality Date  . HEMORROIDECTOMY    . ROTATOR CUFF REPAIR      Current Medications: Current Meds  Medication Sig  . apixaban (ELIQUIS) 5 MG TABS tablet Take 1 tablet (5 mg total) by mouth 2 (two) times daily.  . flecainide (TAMBOCOR) 100 MG tablet TAKE ONE TABLET BY MOUTH TWICE DAILY  . Glucosamine-Chondroit-Vit C-Mn (GLUCOSAMINE CHONDR 1500 COMPLX PO) Take 1 tablet by mouth daily.   . metoprolol succinate (TOPROL-XL) 25 MG 24 hr tablet Take 1 tablet (25 mg total) by mouth daily. Take with or immediately following a meal. (Patient taking differently: Take 12.5 mg by mouth daily. Take with or immediately following a meal.)  .  omeprazole (PRILOSEC) 40 MG capsule TAKE ONE CAPSULE BY MOUTH DAILY MUST HAVE APPOINTMENT  . tamsulosin (FLOMAX) 0.4 MG CAPS capsule TAKE ONE CAPSULE DAILY FOR URINATION  . triamcinolone cream (KENALOG) 0.5 % APPLY TO AFFECTED AREA 3 TIMES DAILY FOR 1 WEEK THEN AS NEEDED     Allergies:   Patient has no known allergies.   Social History   Socioeconomic History  . Marital status: Married    Spouse name: Not on file  . Number of children: Not on file  . Years of education: Not on file  . Highest education level: Not on file  Occupational History  . Not on file  Social Needs  . Financial resource strain: Not on file  . Food insecurity:    Worry: Not on file    Inability: Not on file  . Transportation needs:    Medical: Not on file    Non-medical: Not on file  Tobacco Use  . Smoking status: Former Research scientist (life sciences)  . Smokeless tobacco: Never Used  . Tobacco comment: quit 35 years ago  Substance and Sexual Activity  . Alcohol use: Yes    Comment: occassional drink  . Drug use: No  . Sexual activity: Not on file  Lifestyle  . Physical activity:    Days per week: Not on file    Minutes per session: Not on file  . Stress: Not on file  Relationships  . Social connections:  Talks on phone: Not on file    Gets together: Not on file    Attends religious service: Not on file    Active member of club or organization: Not on file    Attends meetings of clubs or organizations: Not on file    Relationship status: Not on file  Other Topics Concern  . Not on file  Social History Narrative  . Not on file     Family History: The patient's family history includes Colon cancer in his mother; Diabetes in his father; Heart Problems in his father; Liver cancer in his mother; Parkinson's disease in his sister. ROS:   Please see the history of present illness.    All 14 point review of systems negative except as described per history of present illness  EKGs/Labs/Other Studies Reviewed:       Recent Labs: No results found for requested labs within last 8760 hours.  Recent Lipid Panel No results found for: CHOL, TRIG, HDL, CHOLHDL, VLDL, LDLCALC, LDLDIRECT  Physical Exam:    VS:  BP 110/64   Pulse (!) 50   Ht 6\' 4"  (1.93 m)   Wt 189 lb 6.4 oz (85.9 kg)   SpO2 98%   BMI 23.05 kg/m     Wt Readings from Last 3 Encounters:  02/13/19 189 lb 6.4 oz (85.9 kg)  01/10/19 189 lb (85.7 kg)  08/28/18 193 lb (87.5 kg)     GEN:  Well nourished, well developed in no acute distress HEENT: Normal NECK: No JVD; No carotid bruits LYMPHATICS: No lymphadenopathy CARDIAC: RRR, no murmurs, no rubs, no gallops RESPIRATORY:  Clear to auscultation without rales, wheezing or rhonchi  ABDOMEN: Soft, non-tender, non-distended MUSCULOSKELETAL:  No edema; No deformity  SKIN: Warm and dry LOWER EXTREMITIES: no swelling NEUROLOGIC:  Alert and oriented x 3 PSYCHIATRIC:  Normal affect   ASSESSMENT:    1. Atypical chest pain   2. Palpitations   3. Paroxysmal atrial fibrillation (HCC)   4. Orthostatic hypotension    PLAN:    In order of problems listed above:  1. Atypical chest pain denies having any we will continue monitoring.  Stress test done at the end of last year was negative with good exercise capacity 2. Palpitations denies having any doing well from that point of view. 3. Paroxysmal atrial fibrillation, anticoagulated, suppressed with flecainide which I will continue 4. Orthostatic hypotension denies having any   Medication Adjustments/Labs and Tests Ordered: Current medicines are reviewed at length with the patient today.  Concerns regarding medicines are outlined above.  No orders of the defined types were placed in this encounter.  Medication changes: No orders of the defined types were placed in this encounter.   Signed, Park Liter, MD, [facility] 02/13/2019 11:23 AM    [place]

## 2019-02-15 ENCOUNTER — Other Ambulatory Visit: Payer: Self-pay | Admitting: Cardiology

## 2019-05-16 ENCOUNTER — Telehealth: Payer: PPO | Admitting: Cardiology

## 2019-05-25 ENCOUNTER — Encounter: Payer: Self-pay | Admitting: Cardiology

## 2019-05-25 ENCOUNTER — Other Ambulatory Visit: Payer: Self-pay

## 2019-05-25 ENCOUNTER — Ambulatory Visit (INDEPENDENT_AMBULATORY_CARE_PROVIDER_SITE_OTHER): Payer: PPO | Admitting: Cardiology

## 2019-05-25 VITALS — BP 112/66 | HR 57 | Ht 76.0 in | Wt 190.6 lb

## 2019-05-25 DIAGNOSIS — R002 Palpitations: Secondary | ICD-10-CM

## 2019-05-25 DIAGNOSIS — I480 Paroxysmal atrial fibrillation: Secondary | ICD-10-CM | POA: Diagnosis not present

## 2019-05-25 DIAGNOSIS — R0789 Other chest pain: Secondary | ICD-10-CM | POA: Diagnosis not present

## 2019-05-25 DIAGNOSIS — I951 Orthostatic hypotension: Secondary | ICD-10-CM | POA: Diagnosis not present

## 2019-05-25 NOTE — Progress Notes (Signed)
Cardiology Office Note:    Date:  05/25/2019   ID:  Joel Pope, DOB 1941-08-31, MRN [id number]  PCP:  Ronita Hipps, MD  Cardiologist:  Jenne Campus, MD    Referring MD: Ronita Hipps, MD   Chief Complaint  Patient presents with  . Follow-up  Doing well  History of Present Illness:    Joel Pope is a 78 y.o. male with paroxysmal atrial fibrillation, orthostatic hypotension, palpitations comes today to my office for follow-up overall doing well but does describe some dizziness when he gets up quickly he drinks some Gatorade and that seems to be helping.  Denies having any palpitations he also complained of having some chest pain however pain is gone.  He did have a stress test which was negative.  Past Medical History:  Diagnosis Date  . Atrial fibrillation ([place]) 05/23/2018  . BPH (benign prostatic hyperplasia) 05/09/2017  . Gallstones 12/29/2017  . Orthostatic hypotension 05/09/2017  . Palpitations 05/09/2017    Past Surgical History:  Procedure Laterality Date  . HEMORROIDECTOMY    . ROTATOR CUFF REPAIR      Current Medications: Current Meds  Medication Sig  . apixaban (ELIQUIS) 5 MG TABS tablet Take 1 tablet (5 mg total) by mouth 2 (two) times daily.  . flecainide (TAMBOCOR) 100 MG tablet TAKE ONE TABLET BY MOUTH TWICE DAILY  . Glucosamine-Chondroit-Vit C-Mn (GLUCOSAMINE CHONDR 1500 COMPLX PO) Take 1 tablet by mouth daily.   . metoprolol succinate (TOPROL-XL) 25 MG 24 hr tablet Take 1 tablet (25 mg total) by mouth daily. Take with or immediately following a meal. (Patient taking differently: Take 12.5 mg by mouth daily. Take with or immediately following a meal.)  . omeprazole (PRILOSEC) 40 MG capsule TAKE ONE CAPSULE BY MOUTH DAILY MUST HAVE APPOINTMENT  . tamsulosin (FLOMAX) 0.4 MG CAPS capsule TAKE ONE CAPSULE DAILY FOR URINATION  . triamcinolone cream (KENALOG) 0.5 % APPLY TO AFFECTED AREA 3 TIMES DAILY FOR 1 WEEK THEN AS NEEDED     Allergies:   Patient  has no known allergies.   Social History   Socioeconomic History  . Marital status: Married    Spouse name: Not on file  . Number of children: Not on file  . Years of education: Not on file  . Highest education level: Not on file  Occupational History  . Not on file  Social Needs  . Financial resource strain: Not on file  . Food insecurity    Worry: Not on file    Inability: Not on file  . Transportation needs    Medical: Not on file    Non-medical: Not on file  Tobacco Use  . Smoking status: Former Research scientist (life sciences)  . Smokeless tobacco: Never Used  . Tobacco comment: quit 35 years ago  Substance and Sexual Activity  . Alcohol use: Yes    Comment: occassional drink  . Drug use: No  . Sexual activity: Not on file  Lifestyle  . Physical activity    Days per week: Not on file    Minutes per session: Not on file  . Stress: Not on file  Relationships  . Social Herbalist on phone: Not on file    Gets together: Not on file    Attends religious service: Not on file    Active member of club or organization: Not on file    Attends meetings of clubs or organizations: Not on file    Relationship status: Not on file  Other Topics Concern  . Not on file  Social History Narrative  . Not on file     Family History: The patient's family history includes Colon cancer in his mother; Diabetes in his father; Heart Problems in his father; Liver cancer in his mother; Parkinson's disease in his sister. ROS:   Please see the history of present illness.    All 14 point review of systems negative except as described per history of present illness  EKGs/Labs/Other Studies Reviewed:      Recent Labs: No results found for requested labs within last 8760 hours.  Recent Lipid Panel No results found for: CHOL, TRIG, HDL, CHOLHDL, VLDL, LDLCALC, LDLDIRECT  Physical Exam:    VS:  BP 112/66   Pulse (!) 57   Ht 6\' 4"  (1.93 m)   Wt 190 lb 9.6 oz (86.5 kg)   SpO2 98%   BMI 23.20 kg/m      Wt Readings from Last 3 Encounters:  05/25/19 190 lb 9.6 oz (86.5 kg)  02/13/19 189 lb 6.4 oz (85.9 kg)  01/10/19 189 lb (85.7 kg)     GEN:  Well nourished, well developed in no acute distress HEENT: Normal NECK: No JVD; No carotid bruits LYMPHATICS: No lymphadenopathy CARDIAC: RRR, no murmurs, no rubs, no gallops RESPIRATORY:  Clear to auscultation without rales, wheezing or rhonchi  ABDOMEN: Soft, non-tender, non-distended MUSCULOSKELETAL:  No edema; No deformity  SKIN: Warm and dry LOWER EXTREMITIES: no swelling NEUROLOGIC:  Alert and oriented x 3 PSYCHIATRIC:  Normal affect   ASSESSMENT:    1. Orthostatic hypotension   2. Paroxysmal atrial fibrillation (HCC)   3. Palpitations   4. Atypical chest pain    PLAN:    In order of problems listed above:  1. Orthostatic hypotension advised him to keep drinking Gatorade. 2. Paroxysmal atrial fibrillation.  Denies having any.  Continue Tambocor as well as Toprol-XL 3. Palpitations denies having any 4. Atypical chest pain none since last visit.  Stress test done negative   Medication Adjustments/Labs and Tests Ordered: Current medicines are reviewed at length with the patient today.  Concerns regarding medicines are outlined above.  No orders of the defined types were placed in this encounter.  Medication changes: No orders of the defined types were placed in this encounter.   Signed, Park Liter, MD, [facility] 05/25/2019 3:10 PM    [place]

## 2019-05-25 NOTE — Patient Instructions (Signed)
Medication Instructions:  Your physician recommends that you continue on your current medications as directed. Please refer to the Current Medication list given to you today.  If you need a refill on your cardiac medications before your next appointment, please call your pharmacy.   Lab work: None.   If you have labs (blood work) drawn today and your tests are completely normal, you will receive your results only by: . MyChart Message (if you have MyChart) OR . A paper copy in the mail If you have any lab test that is abnormal or we need to change your treatment, we will call you to review the results.  Testing/Procedures: None.   Follow-Up: At [facility], you and your health needs are our priority.  As part of our continuing mission to provide you with exceptional heart care, we have created designated Provider Care Teams.  These Care Teams include your primary Cardiologist (physician) and Advanced Practice Providers (APPs -  Physician Assistants and Nurse Practitioners) who all work together to provide you with the care you need, when you need it. You will need a follow up appointment in 6 months.  Please call our office 2 months in advance to schedule this appointment.  You may see No primary care provider on file. or another member of our [facility] Provider Team in [place]: Brian Munley, MD . Rajan Revankar, MD  Any Other Special Instructions Will Be Listed Below (If Applicable).    

## 2019-06-02 ENCOUNTER — Other Ambulatory Visit: Payer: Self-pay | Admitting: Cardiology

## 2019-08-22 ENCOUNTER — Other Ambulatory Visit: Payer: Self-pay | Admitting: Cardiology

## 2019-09-03 DIAGNOSIS — R3912 Poor urinary stream: Secondary | ICD-10-CM | POA: Diagnosis not present

## 2019-10-01 ENCOUNTER — Other Ambulatory Visit: Payer: Self-pay | Admitting: Cardiology

## 2019-10-25 DIAGNOSIS — Z79899 Other long term (current) drug therapy: Secondary | ICD-10-CM | POA: Diagnosis not present

## 2019-10-25 DIAGNOSIS — Z9181 History of falling: Secondary | ICD-10-CM | POA: Diagnosis not present

## 2019-10-25 DIAGNOSIS — I480 Paroxysmal atrial fibrillation: Secondary | ICD-10-CM | POA: Diagnosis not present

## 2019-10-25 DIAGNOSIS — Z1331 Encounter for screening for depression: Secondary | ICD-10-CM | POA: Diagnosis not present

## 2019-10-25 DIAGNOSIS — K219 Gastro-esophageal reflux disease without esophagitis: Secondary | ICD-10-CM | POA: Diagnosis not present

## 2019-10-25 DIAGNOSIS — Z6825 Body mass index (BMI) 25.0-25.9, adult: Secondary | ICD-10-CM | POA: Diagnosis not present

## 2019-11-23 ENCOUNTER — Ambulatory Visit: Payer: PPO | Admitting: Cardiology

## 2019-11-23 ENCOUNTER — Encounter: Payer: Self-pay | Admitting: Cardiology

## 2019-11-23 ENCOUNTER — Other Ambulatory Visit: Payer: Self-pay

## 2019-11-23 VITALS — BP 118/90 | HR 50 | Ht 76.0 in | Wt 191.3 lb

## 2019-11-23 DIAGNOSIS — R0789 Other chest pain: Secondary | ICD-10-CM | POA: Diagnosis not present

## 2019-11-23 DIAGNOSIS — I480 Paroxysmal atrial fibrillation: Secondary | ICD-10-CM

## 2019-11-23 DIAGNOSIS — I951 Orthostatic hypotension: Secondary | ICD-10-CM | POA: Diagnosis not present

## 2019-11-23 DIAGNOSIS — R002 Palpitations: Secondary | ICD-10-CM | POA: Diagnosis not present

## 2019-11-23 NOTE — Patient Instructions (Signed)
Medication Instructions:  Your physician has recommended you make the following change in your medication:   STOP : Metoprolol   *If you need a refill on your cardiac medications before your next appointment, please call your pharmacy*   Lab Work: None.  If you have labs (blood work) drawn today and your tests are completely normal, you will receive your results only by: Marland Kitchen MyChart Message (if you have MyChart) OR . A paper copy in the mail If you have any lab test that is abnormal or we need to change your treatment, we will call you to review the results.   Testing/Procedures: None.    Follow-Up: At [facility], you and your health needs are our priority.  As part of our continuing mission to provide you with exceptional heart care, we have created designated Provider Care Teams.  These Care Teams include your primary Cardiologist (physician) and Advanced Practice Providers (APPs -  Physician Assistants and Nurse Practitioners) who all work together to provide you with the care you need, when you need it.  We recommend signing up for the patient portal called "MyChart".  Sign up information is provided on this After Visit Summary.  MyChart is used to connect with patients for Virtual Visits (Telemedicine).  Patients are able to view lab/test results, encounter notes, upcoming appointments, etc.  Non-urgent messages can be sent to your provider as well.   To learn more about what you can do with MyChart, go to [email or web address].    Your next appointment:   5 month(s)  The format for your next appointment:   In Person  Provider:   Jenne Campus, MD   Other Instructions

## 2019-11-23 NOTE — Progress Notes (Signed)
Cardiology Office Note:    Date:  11/23/2019   ID:  Joel Pope, DOB [birth year], MRN [id number]  PCP:  Ronita Hipps, MD  Cardiologist:  Jenne Campus, MD    Referring MD: Ronita Hipps, MD   No chief complaint on file. Doing well  History of Present Illness:    Joel Pope is a 79 y.o. male with past medical history significant for paroxysmal atrial fibrillation, successfully suppressed with flecainide, he is also on small dose of beta-blocker as well as anticoagulation which I will continue.  Denies having any issues.  There is no chest pain no tightness no squeezing no pressure no burning in the chest.  He is trying to be active however does not exercise on a regular basis.  Used to go to gym but now because of bandemia.  Cardiac wise seems to be doing well.  Past Medical History:  Diagnosis Date  . Atrial fibrillation ([place]) 05/23/2018  . BPH (benign prostatic hyperplasia) 05/09/2017  . Gallstones 12/29/2017  . Orthostatic hypotension 05/09/2017  . Palpitations 05/09/2017    Past Surgical History:  Procedure Laterality Date  . HEMORROIDECTOMY    . ROTATOR CUFF REPAIR      Current Medications: Current Meds  Medication Sig  . ELIQUIS 5 MG TABS tablet Take 1 tablet (5 mg total) by mouth 2 (two) times daily.  . flecainide (TAMBOCOR) 100 MG tablet TAKE ONE TABLET BY MOUTH TWICE DAILY  . Glucosamine-Chondroit-Vit C-Mn (GLUCOSAMINE CHONDR 1500 COMPLX PO) Take 1 tablet by mouth daily.   Marland Kitchen omeprazole (PRILOSEC) 40 MG capsule TAKE ONE CAPSULE BY MOUTH DAILY MUST HAVE APPOINTMENT  . tamsulosin (FLOMAX) 0.4 MG CAPS capsule TAKE ONE CAPSULE DAILY FOR URINATION  . triamcinolone cream (KENALOG) 0.5 % APPLY TO AFFECTED AREA 3 TIMES DAILY FOR 1 WEEK THEN AS NEEDED  . [DISCONTINUED] metoprolol succinate (TOPROL-XL) 25 MG 24 hr tablet Take 1 tablet (25 mg total) by mouth daily. Take with or immediately following a meal. Please make annual appt with Dr. Curt Bears for refills.  [phone]. 1st attempt.  . [DISCONTINUED] metoprolol succinate (TOPROL-XL) 25 MG 24 hr tablet Take 12.5 mg by mouth daily.     Allergies:   Patient has no known allergies.   Social History   Socioeconomic History  . Marital status: Married    Spouse name: Not on file  . Number of children: Not on file  . Years of education: Not on file  . Highest education level: Not on file  Occupational History  . Not on file  Tobacco Use  . Smoking status: Former Research scientist (life sciences)  . Smokeless tobacco: Never Used  . Tobacco comment: quit 35 years ago  Substance and Sexual Activity  . Alcohol use: Yes    Comment: occassional drink  . Drug use: No  . Sexual activity: Not on file  Other Topics Concern  . Not on file  Social History Narrative  . Not on file   Social Determinants of Health   Financial Resource Strain:   . Difficulty of Paying Living Expenses:   Food Insecurity:   . Worried About Charity fundraiser in the Last Year:   . Arboriculturist in the Last Year:   Transportation Needs:   . Film/video editor (Medical):   Marland Kitchen Lack of Transportation (Non-Medical):   Physical Activity:   . Days of Exercise per Week:   . Minutes of Exercise per Session:   Stress:   . Feeling of Stress :  Social Connections:   . Frequency of Communication with Friends and Family:   . Frequency of Social Gatherings with Friends and Family:   . Attends Religious Services:   . Active Member of Clubs or Organizations:   . Attends Archivist Meetings:   Marland Kitchen Marital Status:      Family History: The patient's family history includes Colon cancer in his mother; Diabetes in his father; Heart Problems in his father; Liver cancer in his mother; Parkinson's disease in his sister. ROS:   Please see the history of present illness.    All 14 point review of systems negative except as described per history of present illness  EKGs/Labs/Other Studies Reviewed:      Recent Labs: No results found for  requested labs within last 8760 hours.  Recent Lipid Panel No results found for: CHOL, TRIG, HDL, CHOLHDL, VLDL, LDLCALC, LDLDIRECT  Physical Exam:    VS:  BP 118/90   Pulse (!) 50   Ht 6\' 4"  (1.93 m)   Wt 191 lb 4.8 oz (86.8 kg)   PF 97 L/min   BMI 23.29 kg/m     Wt Readings from Last 3 Encounters:  11/23/19 191 lb 4.8 oz (86.8 kg)  05/25/19 190 lb 9.6 oz (86.5 kg)  02/13/19 189 lb 6.4 oz (85.9 kg)     GEN:  Well nourished, well developed in no acute distress HEENT: Normal NECK: No JVD; No carotid bruits LYMPHATICS: No lymphadenopathy CARDIAC: RRR, no murmurs, no rubs, no gallops RESPIRATORY:  Clear to auscultation without rales, wheezing or rhonchi  ABDOMEN: Soft, non-tender, non-distended MUSCULOSKELETAL:  No edema; No deformity  SKIN: Warm and dry LOWER EXTREMITIES: no swelling NEUROLOGIC:  Alert and oriented x 3 PSYCHIATRIC:  Normal affect   ASSESSMENT:    1. Paroxysmal atrial fibrillation (HCC)   2. Orthostatic hypotension   3. Palpitations   4. Atypical chest pain    PLAN:    In order of problems listed above:  1. Paroxysmal atrial fibrillation, maintaining sinus rhythm, resting bradycardia on EKG only 50 which is sinus, I will discontinue his metoprolol succinate.  He was taking only 12.5 mg daily anyway.  He complained of having weak and tired which could be related to bradycardia on beta-blocker.  We will continue with anticoagulation. 2. Orthostatic hypotension.  He drink plenty of Gatorade and doing well from that point review. 3. Palpitations: Denies having any. 4. Atypical chest pain denies having any.   Medication Adjustments/Labs and Tests Ordered: Current medicines are reviewed at length with the patient today.  Concerns regarding medicines are outlined above.  Orders Placed This Encounter  Procedures  . EKG 12-Lead   Medication changes: No orders of the defined types were placed in this encounter.   Signed, Park Liter, MD,  [facility] 11/23/2019 10:24 AM    [place]

## 2019-12-20 ENCOUNTER — Other Ambulatory Visit: Payer: Self-pay | Admitting: Cardiology

## 2020-01-14 DIAGNOSIS — K5730 Diverticulosis of large intestine without perforation or abscess without bleeding: Secondary | ICD-10-CM | POA: Diagnosis not present

## 2020-01-14 DIAGNOSIS — K5900 Constipation, unspecified: Secondary | ICD-10-CM | POA: Diagnosis not present

## 2020-03-06 ENCOUNTER — Other Ambulatory Visit: Payer: Self-pay

## 2020-03-10 DIAGNOSIS — N481 Balanitis: Secondary | ICD-10-CM | POA: Diagnosis not present

## 2020-03-18 ENCOUNTER — Ambulatory Visit: Payer: PPO | Admitting: Cardiology

## 2020-03-18 ENCOUNTER — Encounter: Payer: Self-pay | Admitting: Cardiology

## 2020-03-18 ENCOUNTER — Other Ambulatory Visit: Payer: Self-pay

## 2020-03-18 VITALS — BP 122/82 | HR 71 | Ht 76.0 in | Wt 187.4 lb

## 2020-03-18 DIAGNOSIS — R0789 Other chest pain: Secondary | ICD-10-CM

## 2020-03-18 DIAGNOSIS — I951 Orthostatic hypotension: Secondary | ICD-10-CM

## 2020-03-18 DIAGNOSIS — I480 Paroxysmal atrial fibrillation: Secondary | ICD-10-CM

## 2020-03-18 DIAGNOSIS — R002 Palpitations: Secondary | ICD-10-CM

## 2020-03-18 NOTE — Progress Notes (Signed)
Cardiology Office Note:    Date:  03/18/2020   ID:  Joel Pope, DOB 1941-06-25, MRN [id number]  PCP:  Ronita Hipps, MD  Cardiologist:  Jenne Campus, MD    Referring MD: Ronita Hipps, MD   No chief complaint on file. I have palpitations of it may be atrial fibrillation  History of Present Illness:    Joel Pope is a 79 y.o. male with history of paroxysmal atrial fibrillation, successfully suppressed with flecainide, orthostatic hypotension, palpitations.  He comes today to my office for follow-up and overall he is not doing well.  He complained of having palpitations he feels like his heart will skip.  For a moment he thinks there is a paroxysmal atrial fibrillation, he is sometimes have a situation he got to grab something otherwise he may fall down.  He never did fall down he did not passed out.  Also when he feel the sensation he is feels uneasy feeling in his chest.  Past Medical History:  Diagnosis Date  . Atrial fibrillation ([place]) 05/23/2018  . BPH (benign prostatic hyperplasia) 05/09/2017  . Gallstones 12/29/2017  . Orthostatic hypotension 05/09/2017  . Palpitations 05/09/2017    Past Surgical History:  Procedure Laterality Date  . HEMORROIDECTOMY    . ROTATOR CUFF REPAIR      Current Medications: Current Meds  Medication Sig  . ELIQUIS 5 MG TABS tablet Take 1 tablet (5 mg total) by mouth 2 (two) times daily.  . flecainide (TAMBOCOR) 100 MG tablet TAKE ONE TABLET BY MOUTH TWICE DAILY  . Glucosamine-Chondroit-Vit C-Mn (GLUCOSAMINE CHONDR 1500 COMPLX PO) Take 1 tablet by mouth 2 (two) times daily.   Marland Kitchen omeprazole (PRILOSEC) 40 MG capsule TAKE ONE CAPSULE BY MOUTH DAILY MUST HAVE APPOINTMENT  . tamsulosin (FLOMAX) 0.4 MG CAPS capsule TAKE ONE CAPSULE DAILY FOR URINATION  . triamcinolone cream (KENALOG) 0.1 % Use as directed for affected area.  . triamcinolone cream (KENALOG) 0.5 % APPLY TO AFFECTED AREA 3 TIMES DAILY FOR 1 WEEK THEN AS NEEDED     Allergies:    Patient has no known allergies.   Social History   Socioeconomic History  . Marital status: Married    Spouse name: Not on file  . Number of children: Not on file  . Years of education: Not on file  . Highest education level: Not on file  Occupational History  . Not on file  Tobacco Use  . Smoking status: Former Research scientist (life sciences)  . Smokeless tobacco: Never Used  . Tobacco comment: quit 35 years ago  Vaping Use  . Vaping Use: Never used  Substance and Sexual Activity  . Alcohol use: Yes    Comment: occassional drink  . Drug use: No  . Sexual activity: Not on file  Other Topics Concern  . Not on file  Social History Narrative  . Not on file   Social Determinants of Health   Financial Resource Strain:   . Difficulty of Paying Living Expenses:   Food Insecurity:   . Worried About Charity fundraiser in the Last Year:   . Arboriculturist in the Last Year:   Transportation Needs:   . Film/video editor (Medical):   Marland Kitchen Lack of Transportation (Non-Medical):   Physical Activity:   . Days of Exercise per Week:   . Minutes of Exercise per Session:   Stress:   . Feeling of Stress :   Social Connections:   . Frequency of Communication with Friends and  Family:   . Frequency of Social Gatherings with Friends and Family:   . Attends Religious Services:   . Active Member of Clubs or Organizations:   . Attends Archivist Meetings:   Marland Kitchen Marital Status:      Family History: The patient's family history includes Colon cancer in his mother; Diabetes in his father; Heart Problems in his father; Liver cancer in his mother; Parkinson's disease in his sister. ROS:   Please see the history of present illness.    All 14 point review of systems negative except as described per history of present illness  EKGs/Labs/Other Studies Reviewed:      Recent Labs: No results found for requested labs within last 8760 hours.  Recent Lipid Panel No results found for: CHOL, TRIG, HDL,  CHOLHDL, VLDL, LDLCALC, LDLDIRECT  Physical Exam:    VS:  BP 122/82 (BP Location: Left Arm, Patient Position: Sitting, Cuff Size: Normal)   Pulse 71   Ht 6\' 4"  (1.93 m)   Wt 187 lb 6.4 oz (85 kg)   SpO2 97%   BMI 22.81 kg/m     Wt Readings from Last 3 Encounters:  03/18/20 187 lb 6.4 oz (85 kg)  11/23/19 191 lb 4.8 oz (86.8 kg)  05/25/19 190 lb 9.6 oz (86.5 kg)     GEN:  Well nourished, well developed in no acute distress HEENT: Normal NECK: No JVD; No carotid bruits LYMPHATICS: No lymphadenopathy CARDIAC: RRR, no murmurs, no rubs, no gallops RESPIRATORY:  Clear to auscultation without rales, wheezing or rhonchi  ABDOMEN: Soft, non-tender, non-distended MUSCULOSKELETAL:  No edema; No deformity  SKIN: Warm and dry LOWER EXTREMITIES: no swelling NEUROLOGIC:  Alert and oriented x 3 PSYCHIATRIC:  Normal affect   ASSESSMENT:    1. Paroxysmal atrial fibrillation (HCC)   2. Palpitations   3. Atypical chest pain   4. Orthostatic hypotension    PLAN:    In order of problems listed above:  1. Paroxysmal atrial fibrillation.  He clearly does have some arrhythmia right now.  We did EKG today which shows sinus rhythm with PVCs PVCs with left ventricle origin.  I will ask him to wear a monitor to see exactly what kind of arrhythmia if any with dealing with.  The question is is he feeling like that because of proximal of atrial fibrillation of frequent ventricular ectopy or simply bradycardia.  Last time we cut down his beta-blocker because of bradycardia therefore we need to put monitor to clearly determine what the problem is. 2. Palpitations: Plan as outlined above. 3. Atypical chest pain happening only while palpitation happened he may require CAD work-up but for now actually investigate what kind of arrhythmia with dealing with. 4. Orthostatic hypotension denies having any issue with quick getting up.   Medication Adjustments/Labs and Tests Ordered: Current medicines are  reviewed at length with the patient today.  Concerns regarding medicines are outlined above.  Orders Placed This Encounter  Procedures  . LONG TERM MONITOR (3-14 DAYS)  . EKG 12-Lead   Medication changes: No orders of the defined types were placed in this encounter.   Signed, Park Liter, MD, [facility] 03/18/2020 10:50 AM    [place]

## 2020-03-18 NOTE — Patient Instructions (Signed)
Medication Instructions:  Your physician recommends that you continue on your current medications as directed. Please refer to the Current Medication list given to you today.  *If you need a refill on your cardiac medications before your next appointment, please call your pharmacy*   Lab Work: None If you have labs (blood work) drawn today and your tests are completely normal, you will receive your results only by: . MyChart Message (if you have MyChart) OR . A paper copy in the mail If you have any lab test that is abnormal or we need to change your treatment, we will call you to review the results.   Testing/Procedures: A zio monitor was ordered today. It will remain on for 7 days. You will then return monitor and event diary in provided box. It takes 1-2 weeks for report to be downloaded and returned to us. We will call you with the results. If monitor falls off or has orange flashing light, please call Zio for further instructions.      Follow-Up: At [facility], you and your health needs are our priority.  As part of our continuing mission to provide you with exceptional heart care, we have created designated Provider Care Teams.  These Care Teams include your primary Cardiologist (physician) and Advanced Practice Providers (APPs -  Physician Assistants and Nurse Practitioners) who all work together to provide you with the care you need, when you need it.  We recommend signing up for the patient portal called "MyChart".  Sign up information is provided on this After Visit Summary.  MyChart is used to connect with patients for Virtual Visits (Telemedicine).  Patients are able to view lab/test results, encounter notes, upcoming appointments, etc.  Non-urgent messages can be sent to your provider as well.   To learn more about what you can do with MyChart, go to [email or web address].    Your next appointment:   1 month(s)  The format for your next appointment:   In  Person  Provider:   Robert Krasowski, MD   Other Instructions   

## 2020-03-20 ENCOUNTER — Ambulatory Visit (INDEPENDENT_AMBULATORY_CARE_PROVIDER_SITE_OTHER): Payer: PPO

## 2020-03-20 DIAGNOSIS — I480 Paroxysmal atrial fibrillation: Secondary | ICD-10-CM | POA: Diagnosis not present

## 2020-03-20 DIAGNOSIS — R002 Palpitations: Secondary | ICD-10-CM | POA: Diagnosis not present

## 2020-04-12 DIAGNOSIS — R002 Palpitations: Secondary | ICD-10-CM | POA: Diagnosis not present

## 2020-04-29 ENCOUNTER — Ambulatory Visit: Payer: PPO | Admitting: Cardiology

## 2020-04-29 ENCOUNTER — Other Ambulatory Visit: Payer: Self-pay

## 2020-04-29 ENCOUNTER — Encounter: Payer: Self-pay | Admitting: Cardiology

## 2020-04-29 VITALS — BP 138/82 | HR 54 | Ht 76.0 in | Wt 192.2 lb

## 2020-04-29 DIAGNOSIS — R079 Chest pain, unspecified: Secondary | ICD-10-CM | POA: Diagnosis not present

## 2020-04-29 DIAGNOSIS — I951 Orthostatic hypotension: Secondary | ICD-10-CM

## 2020-04-29 DIAGNOSIS — R0600 Dyspnea, unspecified: Secondary | ICD-10-CM

## 2020-04-29 DIAGNOSIS — R0789 Other chest pain: Secondary | ICD-10-CM

## 2020-04-29 DIAGNOSIS — I480 Paroxysmal atrial fibrillation: Secondary | ICD-10-CM

## 2020-04-29 DIAGNOSIS — R0609 Other forms of dyspnea: Secondary | ICD-10-CM

## 2020-04-29 HISTORY — DX: Other forms of dyspnea: R06.09

## 2020-04-29 HISTORY — DX: Dyspnea, unspecified: R06.00

## 2020-04-29 NOTE — Patient Instructions (Signed)
Medication Instructions:  Your physician recommends that you continue on your current medications as directed. Please refer to the Current Medication list given to you today.  *If you need a refill on your cardiac medications before your next appointment, please call your pharmacy*   Lab Work: Your physician recommends that you return for lab work 3-7 days before ct: bmp   If you have labs (blood work) drawn today and your tests are completely normal, you will receive your results only by: Marland Kitchen MyChart Message (if you have MyChart) OR . A paper copy in the mail If you have any lab test that is abnormal or we need to change your treatment, we will call you to review the results.   Testing/Procedures: Your physician has requested that you have an echocardiogram. Echocardiography is a painless test that uses sound waves to create images of your heart. It provides your doctor with information about the size and shape of your heart and how well your heart's chambers and valves are working. This procedure takes approximately one hour. There are no restrictions for this procedure.  Your cardiac CT will be scheduled at one of the below locations:   [facility] [address] [place], [place] [address] [phone]  [facility] [address] [place], [place] [address] [phone]  If scheduled at [facility], please arrive at the [facility] main entrance of [facility] 30 minutes prior to test start time. Proceed to the [facility] Radiology Department (first floor) to check-in and test prep.  If scheduled at [facility], please arrive 15 mins early for check-in and test prep.  Please follow these instructions carefully (unless otherwise directed):  Hold all erectile dysfunction medications at least 3 days (72 hrs) prior to test.  On the Night Before the Test: . Be sure to Drink  plenty of water. . Do not consume any caffeinated/decaffeinated beverages or chocolate 12 hours prior to your test. . Do not take any antihistamines 12 hours prior to your test.   On the Day of the Test: . Drink plenty of water. Do not drink any water within one hour of the test. . Do not eat any food 4 hours prior to the test. . You may take your regular medications prior to the test.         After the Test: . Drink plenty of water. . After receiving IV contrast, you may experience a mild flushed feeling. This is normal. . On occasion, you may experience a mild rash up to 24 hours after the test. This is not dangerous. If this occurs, you can take Benadryl 25 mg and increase your fluid intake. . If you experience trouble breathing, this can be serious. If it is severe call 911 IMMEDIATELY. If it is mild, please call our office. . If you take any of these medications: Glipizide/Metformin, Avandament, Glucavance, please do not take 48 hours after completing test unless otherwise instructed.   Once we have confirmed authorization from your insurance company, we will call you to set up a date and time for your test. Based on how quickly your insurance processes prior authorizations requests, please allow up to 4 weeks to be contacted for scheduling your Cardiac CT appointment. Be advised that routine Cardiac CT appointments could be scheduled as many as 8 weeks after your provider has ordered it.  For non-scheduling related questions, please contact the cardiac imaging nurse  navigator should you have any questions/concerns: Marchia Bond, Cardiac Imaging Nurse Navigator Burley Saver, Interim Cardiac Imaging Nurse Navigator [facility] Direct Office Dial: [phone]   For scheduling needs, including cancellations and rescheduling, please call Vivien Rota at [phone], option [phone].      Follow-Up: At [facility], you and your health needs are our priority.  As part  of our continuing mission to provide you with exceptional heart care, we have created designated Provider Care Teams.  These Care Teams include your primary Cardiologist (physician) and Advanced Practice Providers (APPs -  Physician Assistants and Nurse Practitioners) who all work together to provide you with the care you need, when you need it.  We recommend signing up for the patient portal called "MyChart".  Sign up information is provided on this After Visit Summary.  MyChart is used to connect with patients for Virtual Visits (Telemedicine).  Patients are able to view lab/test results, encounter notes, upcoming appointments, etc.  Non-urgent messages can be sent to your provider as well.   To learn more about what you can do with MyChart, go to [email or web address].    Your next appointment:   6 week(s)  The format for your next appointment:   In Person  Provider:   Jenne Campus, MD   Other Instructions   Echocardiogram An echocardiogram is a procedure that uses painless sound waves (ultrasound) to produce an image of the heart. Images from an echocardiogram can provide important information about:  Signs of coronary artery disease (CAD).  Aneurysm detection. An aneurysm is a weak or damaged part of an artery wall that bulges out from the normal force of blood pumping through the body.  Heart size and shape. Changes in the size or shape of the heart can be associated with certain conditions, including heart failure, aneurysm, and CAD.  Heart muscle function.  Heart valve function.  Signs of a past heart attack.  Fluid buildup around the heart.  Thickening of the heart muscle.  A tumor or infectious growth around the heart valves. Tell a health care provider about:  Any allergies you have.  All medicines you are taking, including vitamins, herbs, eye drops, creams, and over-the-counter medicines.  Any blood disorders you have.  Any surgeries you have  had.  Any medical conditions you have.  Whether you are pregnant or may be pregnant. What are the risks? Generally, this is a safe procedure. However, problems may occur, including:  Allergic reaction to dye (contrast) that may be used during the procedure. What happens before the procedure? No specific preparation is needed. You may eat and drink normally. What happens during the procedure?   An IV tube may be inserted into one of your veins.  You may receive contrast through this tube. A contrast is an injection that improves the quality of the pictures from your heart.  A gel will be applied to your chest.  A wand-like tool (transducer) will be moved over your chest. The gel will help to transmit the sound waves from the transducer.  The sound waves will harmlessly bounce off of your heart to allow the heart images to be captured in real-time motion. The images will be recorded on a computer. The procedure may vary among health care providers and hospitals. What happens after the procedure?  You may return to your normal, everyday life, including diet, activities, and medicines, unless your health care provider tells you not to do that. Summary  An echocardiogram is  a procedure that uses painless sound waves (ultrasound) to produce an image of the heart.  Images from an echocardiogram can provide important information about the size and shape of your heart, heart muscle function, heart valve function, and fluid buildup around your heart.  You do not need to do anything to prepare before this procedure. You may eat and drink normally.  After the echocardiogram is completed, you may return to your normal, everyday life, unless your health care provider tells you not to do that. This information is not intended to replace advice given to you by your health care provider. Make sure you discuss any questions you have with your health care provider. Document Revised: 12/21/2018  Document Reviewed: 10/02/2016 Elsevier Patient Education  [place].   Cardiac CT Angiogram A cardiac CT angiogram is a procedure to look at the heart and the area around the heart. It may be done to help find the cause of chest pains or other symptoms of heart disease. During this procedure, a substance called contrast dye is injected into the blood vessels in the area to be checked. A large X-ray machine, called a CT scanner, then takes detailed pictures of the heart and the surrounding area. The procedure is also sometimes called a coronary CT angiogram, coronary artery scanning, or CTA. A cardiac CT angiogram allows the health care provider to see how well blood is flowing to and from the heart. The health care provider will be able to see if there are any problems, such as:  Blockage or narrowing of the coronary arteries in the heart.  Fluid around the heart.  Signs of weakness or disease in the muscles, valves, and tissues of the heart. Tell a health care provider about:  Any allergies you have. This is especially important if you have had a previous allergic reaction to contrast dye.  All medicines you are taking, including vitamins, herbs, eye drops, creams, and over-the-counter medicines.  Any blood disorders you have.  Any surgeries you have had.  Any medical conditions you have.  Whether you are pregnant or may be pregnant.  Any anxiety disorders, chronic pain, or other conditions you have that may increase your stress or prevent you from lying still. What are the risks? Generally, this is a safe procedure. However, problems may occur, including:  Bleeding.  Infection.  Allergic reactions to medicines or dyes.  Damage to other structures or organs.  Kidney damage from the contrast dye that is used.  Increased risk of cancer from radiation exposure. This risk is low. Talk with your health care provider about: ? The risks and benefits of testing. ? How you  can receive the lowest dose of radiation. What happens before the procedure?  Wear comfortable clothing and remove any jewelry, glasses, dentures, and hearing aids.  Follow instructions from your health care provider about eating and drinking. This may include: ? For 12 hours before the procedure -- avoid caffeine. This includes tea, coffee, soda, energy drinks, and diet pills. Drink plenty of water or other fluids that do not have caffeine in them. Being well hydrated can prevent complications. ? For 4-6 hours before the procedure -- stop eating and drinking. The contrast dye can cause nausea, but this is less likely if your stomach is empty.  Ask your health care provider about changing or stopping your regular medicines. This is especially important if you are taking diabetes medicines, blood thinners, or medicines to treat problems with erections (erectile dysfunction). What  happens during the procedure?   Hair on your chest may need to be removed so that small sticky patches called electrodes can be placed on your chest. These will transmit information that helps to monitor your heart during the procedure.  An IV will be inserted into one of your veins.  You might be given a medicine to control your heart rate during the procedure. This will help to ensure that good images are obtained.  You will be asked to lie on an exam table. This table will slide in and out of the CT machine during the procedure.  Contrast dye will be injected into the IV. You might feel warm, or you may get a metallic taste in your mouth.  You will be given a medicine called nitroglycerin. This will relax or dilate the arteries in your heart.  The table that you are lying on will move into the CT machine tunnel for the scan.  The person running the machine will give you instructions while the scans are being done. You may be asked to: ? Keep your arms above your head. ? Hold your breath. ? Stay very still,  even if the table is moving.  When the scanning is complete, you will be moved out of the machine.  The IV will be removed. The procedure may vary among health care providers and hospitals. What can I expect after the procedure? After your procedure, it is common to have:  A metallic taste in your mouth from the contrast dye.  A feeling of warmth.  A headache from the nitroglycerin. Follow these instructions at home:  Take over-the-counter and prescription medicines only as told by your health care provider.  If you are told, drink enough fluid to keep your urine pale yellow. This will help to flush the contrast dye out of your body.  Most people can return to their normal activities right after the procedure. Ask your health care provider what activities are safe for you.  It is up to you to get the results of your procedure. Ask your health care provider, or the department that is doing the procedure, when your results will be ready.  Keep all follow-up visits as told by your health care provider. This is important. Contact a health care provider if:  You have any symptoms of allergy to the contrast dye. These include: ? Shortness of breath. ? Rash or hives. ? A racing heartbeat. Summary  A cardiac CT angiogram is a procedure to look at the heart and the area around the heart. It may be done to help find the cause of chest pains or other symptoms of heart disease.  During this procedure, a large X-ray machine, called a CT scanner, takes detailed pictures of the heart and the surrounding area after a contrast dye has been injected into blood vessels in the area.  Ask your health care provider about changing or stopping your regular medicines before the procedure. This is especially important if you are taking diabetes medicines, blood thinners, or medicines to treat erectile dysfunction.  If you are told, drink enough fluid to keep your urine pale yellow. This will help to  flush the contrast dye out of your body. This information is not intended to replace advice given to you by your health care provider. Make sure you discuss any questions you have with your health care provider. Document Revised: 04/25/2019 Document Reviewed: 04/25/2019 Elsevier Patient Education  [place].

## 2020-04-29 NOTE — Progress Notes (Signed)
Cardiology Office Note:    Date:  04/29/2020   ID:  Joel Pope, DOB October 02, 1940, MRN [id number]  PCP:  Joel Hipps, MD  Cardiologist:  Joel Campus, MD    Referring MD: Joel Hipps, MD   No chief complaint on file. I am doing fine  History of Present Illness:    Joel Pope is a 79 y.o. male with past medical history significant for orthostatic hypotension, paroxysmal atrial fibrillation, anticoagulated, now on Tambocor, recently complained of having frequent palpitations.  He did wear monitor which showed frequent ventricular ectopy, also have 1 run of nonsustained ventricular tachycardia.  Since have seen him last time he tells me he is feeling better.  He gets less palpitations and this does not bother him much.  However he said that he started going back to gym when he is at the gym he will get short of breath quite easily he said he can last on the treadmill for about half an hour to increase the speed from 2-3 and then he have to slow down.  He used to be able to do 5 mph with no difficulties but now definitely is a problem.  On top of that sometimes when he does not he describes some uneasy sensation in the chest.  Past Medical History:  Diagnosis Date  . Atrial fibrillation ([place]) 05/23/2018  . BPH (benign prostatic hyperplasia) 05/09/2017  . Gallstones 12/29/2017  . Orthostatic hypotension 05/09/2017  . Palpitations 05/09/2017    Past Surgical History:  Procedure Laterality Date  . HEMORROIDECTOMY    . ROTATOR CUFF REPAIR      Current Medications: Current Meds  Medication Sig  . ELIQUIS 5 MG TABS tablet Take 1 tablet (5 mg total) by mouth 2 (two) times daily.  . flecainide (TAMBOCOR) 100 MG tablet TAKE ONE TABLET BY MOUTH TWICE DAILY  . Glucosamine-Chondroit-Vit C-Mn (GLUCOSAMINE CHONDR 1500 COMPLX PO) Take 1 tablet by mouth 2 (two) times daily.   Marland Kitchen omeprazole (PRILOSEC) 40 MG capsule TAKE ONE CAPSULE BY MOUTH DAILY MUST HAVE APPOINTMENT  . tamsulosin  (FLOMAX) 0.4 MG CAPS capsule TAKE ONE CAPSULE DAILY FOR URINATION  . triamcinolone cream (KENALOG) 0.1 % Use as directed for affected area.     Allergies:   Patient has no known allergies.   Social History   Socioeconomic History  . Marital status: Married    Spouse name: Not on file  . Number of children: Not on file  . Years of education: Not on file  . Highest education level: Not on file  Occupational History  . Not on file  Tobacco Use  . Smoking status: Former Research scientist (life sciences)  . Smokeless tobacco: Never Used  . Tobacco comment: quit 35 years ago  Vaping Use  . Vaping Use: Never used  Substance and Sexual Activity  . Alcohol use: Yes    Comment: occassional drink  . Drug use: No  . Sexual activity: Not on file  Other Topics Concern  . Not on file  Social History Narrative  . Not on file   Social Determinants of Health   Financial Resource Strain:   . Difficulty of Paying Living Expenses:   Food Insecurity:   . Worried About Charity fundraiser in the Last Year:   . Arboriculturist in the Last Year:   Transportation Needs:   . Film/video editor (Medical):   Marland Kitchen Lack of Transportation (Non-Medical):   Physical Activity:   . Days of Exercise per  Week:   . Minutes of Exercise per Session:   Stress:   . Feeling of Stress :   Social Connections:   . Frequency of Communication with Friends and Family:   . Frequency of Social Gatherings with Friends and Family:   . Attends Religious Services:   . Active Member of Clubs or Organizations:   . Attends Archivist Meetings:   Marland Kitchen Marital Status:      Family History: The patient's family history includes Colon cancer in his mother; Diabetes in his father; Heart Problems in his father; Liver cancer in his mother; Parkinson's disease in his sister. ROS:   Please see the history of present illness.    All 14 point review of systems negative except as described per history of present illness  EKGs/Labs/Other Studies  Reviewed:     He is EKG today shows sinus bradycardia rate of 54 nonspecific ST segment changes Recent Labs: No results found for requested labs within last 8760 hours.  Recent Lipid Panel No results found for: CHOL, TRIG, HDL, CHOLHDL, VLDL, LDLCALC, LDLDIRECT  Physical Exam:    VS:  BP 138/82   Pulse (!) 54   Ht 6\' 4"  (1.93 m)   Wt 192 lb 3.2 oz (87.2 kg)   SpO2 98%   BMI 23.40 kg/m     Wt Readings from Last 3 Encounters:  04/29/20 192 lb 3.2 oz (87.2 kg)  03/18/20 187 lb 6.4 oz (85 kg)  11/23/19 191 lb 4.8 oz (86.8 kg)     GEN:  Well nourished, well developed in no acute distress HEENT: Normal NECK: No JVD; No carotid bruits LYMPHATICS: No lymphadenopathy CARDIAC: RRR, no murmurs, no rubs, no gallops RESPIRATORY:  Clear to auscultation without rales, wheezing or rhonchi  ABDOMEN: Soft, non-tender, non-distended MUSCULOSKELETAL:  No edema; No deformity  SKIN: Warm and dry LOWER EXTREMITIES: no swelling NEUROLOGIC:  Alert and oriented x 3 PSYCHIATRIC:  Normal affect   ASSESSMENT:    1. Chest pain of uncertain etiology   2. Dyspnea on exertion   3. Atypical chest pain   4. Paroxysmal atrial fibrillation (HCC)   5. Orthostatic hypotension    PLAN:    In order of problems listed above:  1. Chest pain of uncertain etiology concerning.  The fact that he takes flecainide make the situation difficult.  I will ask him to have coronary CT angio to rule out significant coronary artery disease. 2. Dyspnea on exertion: Echocardiogram will be done to check left ventricle ejection fraction. 3. Paroxysmal atrial fibrillation no evidence of atrial fibrillation based on recent monitor that I reviewed with the patient. 4. Orthostatic hypotension: Denies having any.   Medication Adjustments/Labs and Tests Ordered: Current medicines are reviewed at length with the patient today.  Concerns regarding medicines are outlined above.  Orders Placed This Encounter  Procedures  . CT  CORONARY MORPH W/CTA COR W/SCORE W/CA W/CM &/OR WO/CM  . CT CORONARY FRACTIONAL FLOW RESERVE DATA PREP  . CT CORONARY FRACTIONAL FLOW RESERVE FLUID ANALYSIS  . Basic metabolic panel  . ECHOCARDIOGRAM COMPLETE   Medication changes: No orders of the defined types were placed in this encounter.   Signed, Park Liter, MD, [facility] 04/29/2020 5:05 PM    [place]

## 2020-04-30 ENCOUNTER — Ambulatory Visit: Payer: PPO | Admitting: Cardiology

## 2020-04-30 NOTE — Addendum Note (Signed)
Addended by: Crist Kruszka, Jonelle Sidle L on: 04/30/2020 07:43 AM   Modules accepted: Orders

## 2020-05-05 ENCOUNTER — Ambulatory Visit: Payer: PPO | Admitting: Cardiology

## 2020-05-21 ENCOUNTER — Other Ambulatory Visit: Payer: PPO

## 2020-05-22 ENCOUNTER — Ambulatory Visit (INDEPENDENT_AMBULATORY_CARE_PROVIDER_SITE_OTHER): Payer: PPO

## 2020-05-22 ENCOUNTER — Other Ambulatory Visit: Payer: Self-pay

## 2020-05-22 DIAGNOSIS — R079 Chest pain, unspecified: Secondary | ICD-10-CM

## 2020-05-22 LAB — ECHOCARDIOGRAM COMPLETE
Area-P 1/2: 2.66 cm2
Calc EF: 55.8 %
S' Lateral: 3 cm
Single Plane A2C EF: 62.3 %
Single Plane A4C EF: 48.7 %

## 2020-05-22 NOTE — Progress Notes (Signed)
Complete echocardiogram performed.  Jimmy Donte Lenzo RDCS, RVT  

## 2020-05-28 DIAGNOSIS — K5730 Diverticulosis of large intestine without perforation or abscess without bleeding: Secondary | ICD-10-CM | POA: Diagnosis not present

## 2020-06-06 ENCOUNTER — Telehealth: Payer: Self-pay | Admitting: *Deleted

## 2020-06-06 NOTE — Telephone Encounter (Signed)
Please make recommendations on Eliquis. Thank you.   Joel Pope

## 2020-06-06 NOTE — Telephone Encounter (Signed)
    [facility] Pre-operative Risk Assessment    Request for surgical clearance:  1. What type of surgery is being performed? Colonoscopy   2. When is this surgery scheduled? 06/24/20   3. What type of clearance is required (medical clearance vs. Pharmacy clearance to hold med vs. Both)? Pharmacy  4. Are there any medications that need to be held prior to surgery and how long?Eliquis   5. Practice name and name of physician performing surgery? [facility]   6. What is your office phone number [phone]    7.   What is your office fax [phone]  8.   Anesthesia type (None, local, MAC, general) ? Not specified   Roselie Skinner 06/06/2020, 4:43 PM  _________________________________________________________________   (provider comments below)

## 2020-06-09 NOTE — Telephone Encounter (Signed)
Patient with diagnosis of afib on Eliquis for anticoagulation.    Procedure: colonoscopy Date of procedure: 06/24/20  CHADS2-VASc score of 2 (age x2)  CrCl 73mL/min Platelet count 147K  Per office protocol, patient can hold Eliquis for 1-2 days prior to procedure.

## 2020-06-09 NOTE — Telephone Encounter (Signed)
Patient was seen recently by Dr. Agustin Cree for chest pain and palpitation. Echocardiogram was reassuring. Dr. Agustin Cree recommended a coronary CT which does not appears to have been scheduled.   Dr. Agustin Cree, please review. This is a pharmacy clearance for a low risk colonoscopy, our clinical pharmacist has commented on the Eliquis. Do you prefer to have the coronary CT done prior to final clearance?

## 2020-06-10 NOTE — Telephone Encounter (Signed)
Unable to reach the patient, callback pool to inform patient to hold Eliquis for 2 days prior to GI procedure and restart as soon as possible at the recommendation of the GI physician. I have forwarded clearance to the requesting provider

## 2020-06-10 NOTE — Telephone Encounter (Signed)
Yes he will proceed with colonoscopy with instruction as given by pharmacist.  Overall colonoscopy is considered a low risk procedure

## 2020-06-11 NOTE — Telephone Encounter (Signed)
Pt has been notified he has been cleared for his colonoscopy. Pt aware he will need to hold his Eliquis x 2 days prior to procedure and will restart once the GI doctor feels safe. Pt thanked me for the call. I will send clearance notes to requesting office pt is cleared.

## 2020-06-13 ENCOUNTER — Encounter: Payer: Self-pay | Admitting: Cardiology

## 2020-06-13 ENCOUNTER — Ambulatory Visit (INDEPENDENT_AMBULATORY_CARE_PROVIDER_SITE_OTHER): Payer: PPO | Admitting: Cardiology

## 2020-06-13 ENCOUNTER — Other Ambulatory Visit: Payer: Self-pay

## 2020-06-13 VITALS — BP 124/68 | HR 66 | Ht 76.0 in | Wt 188.0 lb

## 2020-06-13 DIAGNOSIS — R0600 Dyspnea, unspecified: Secondary | ICD-10-CM

## 2020-06-13 DIAGNOSIS — R079 Chest pain, unspecified: Secondary | ICD-10-CM | POA: Diagnosis not present

## 2020-06-13 DIAGNOSIS — I480 Paroxysmal atrial fibrillation: Secondary | ICD-10-CM | POA: Diagnosis not present

## 2020-06-13 DIAGNOSIS — R0609 Other forms of dyspnea: Secondary | ICD-10-CM

## 2020-06-13 DIAGNOSIS — R0789 Other chest pain: Secondary | ICD-10-CM

## 2020-06-13 NOTE — Progress Notes (Signed)
Cardiology Office Note:    Date:  06/13/2020   ID:  Joel Pope, DOB 1941-08-25, MRN [id number]  PCP:  Ronita Hipps, MD  Cardiologist:  Jenne Campus, MD    Referring MD: Ronita Hipps, MD   No chief complaint on file. I am doing fine  History of Present Illness:    Joel Pope is a 79 y.o. male past medical history significant for paroxysmal atrial fibrillation, anticoagulated which I will continue, orthostatic hypotension, atypical chest pain.  We are waiting coronary CT angio.  There is no further coronary CT angios the 5 that he does have atypical chest pain on top of that he did have some runs of nonsustained ventricular tachycardia on the monitor.  Overall he is doing well he described to have some skipped beats but otherwise doing well.  He goes to gym on the regular basis have no difficulty doing it.  Past Medical History:  Diagnosis Date  . Atrial fibrillation ([place]) 05/23/2018  . BPH (benign prostatic hyperplasia) 05/09/2017  . Gallstones 12/29/2017  . Orthostatic hypotension 05/09/2017  . Palpitations 05/09/2017    Past Surgical History:  Procedure Laterality Date  . HEMORROIDECTOMY    . ROTATOR CUFF REPAIR      Current Medications: Current Meds  Medication Sig  . ELIQUIS 5 MG TABS tablet Take 1 tablet (5 mg total) by mouth 2 (two) times daily.  . flecainide (TAMBOCOR) 100 MG tablet TAKE ONE TABLET BY MOUTH TWICE DAILY  . Glucosamine-Chondroit-Vit C-Mn (GLUCOSAMINE CHONDR 1500 COMPLX PO) Take 1 tablet by mouth 2 (two) times daily.   Marland Kitchen omeprazole (PRILOSEC) 40 MG capsule TAKE ONE CAPSULE BY MOUTH DAILY MUST HAVE APPOINTMENT  . tamsulosin (FLOMAX) 0.4 MG CAPS capsule TAKE ONE CAPSULE DAILY FOR URINATION  . triamcinolone cream (KENALOG) 0.1 % Use as directed for affected area.     Allergies:   Patient has no known allergies.   Social History   Socioeconomic History  . Marital status: Married    Spouse name: Not on file  . Number of children: Not on file   . Years of education: Not on file  . Highest education level: Not on file  Occupational History  . Not on file  Tobacco Use  . Smoking status: Former Research scientist (life sciences)  . Smokeless tobacco: Never Used  . Tobacco comment: quit 35 years ago  Vaping Use  . Vaping Use: Never used  Substance and Sexual Activity  . Alcohol use: Yes    Comment: occassional drink  . Drug use: No  . Sexual activity: Not on file  Other Topics Concern  . Not on file  Social History Narrative  . Not on file   Social Determinants of Health   Financial Resource Strain:   . Difficulty of Paying Living Expenses: Not on file  Food Insecurity:   . Worried About Charity fundraiser in the Last Year: Not on file  . Ran Out of Food in the Last Year: Not on file  Transportation Needs:   . Lack of Transportation (Medical): Not on file  . Lack of Transportation (Non-Medical): Not on file  Physical Activity:   . Days of Exercise per Week: Not on file  . Minutes of Exercise per Session: Not on file  Stress:   . Feeling of Stress : Not on file  Social Connections:   . Frequency of Communication with Friends and Family: Not on file  . Frequency of Social Gatherings with Friends and Family: Not on  file  . Attends Religious Services: Not on file  . Active Member of Clubs or Organizations: Not on file  . Attends Archivist Meetings: Not on file  . Marital Status: Not on file     Family History: The patient's family history includes Colon cancer in his mother; Diabetes in his father; Heart Problems in his father; Liver cancer in his mother; Parkinson's disease in his sister. ROS:   Please see the history of present illness.    All 14 point review of systems negative except as described per history of present illness  EKGs/Labs/Other Studies Reviewed:      Recent Labs: No results found for requested labs within last 8760 hours.  Recent Lipid Panel No results found for: CHOL, TRIG, HDL, CHOLHDL, VLDL,  LDLCALC, LDLDIRECT  Physical Exam:    VS:  BP 124/68 (BP Location: Left Arm, Patient Position: Sitting, Cuff Size: Normal)   Pulse 66   Ht 6\' 4"  (1.93 m)   Wt 188 lb (85.3 kg)   SpO2 97%   BMI 22.88 kg/m     Wt Readings from Last 3 Encounters:  06/13/20 188 lb (85.3 kg)  04/29/20 192 lb 3.2 oz (87.2 kg)  03/18/20 187 lb 6.4 oz (85 kg)     GEN:  Well nourished, well developed in no acute distress HEENT: Normal NECK: No JVD; No carotid bruits LYMPHATICS: No lymphadenopathy CARDIAC: RRR, no murmurs, no rubs, no gallops RESPIRATORY:  Clear to auscultation without rales, wheezing or rhonchi  ABDOMEN: Soft, non-tender, non-distended MUSCULOSKELETAL:  No edema; No deformity  SKIN: Warm and dry LOWER EXTREMITIES: no swelling NEUROLOGIC:  Alert and oriented x 3 PSYCHIATRIC:  Normal affect   ASSESSMENT:    1. Paroxysmal atrial fibrillation (HCC)   2. Dyspnea on exertion   3. Atypical chest pain    PLAN:    In order of problems listed above:  1. Paroxysmal atrial fibrillation suppressed with Tambocor as well as anticoagulated Eliquis which I will continue. 2. Dyspnea on exertion, denies having any. 3. Atypical chest pain awaiting coronary CT angio. 4. Overall he seems to be doing better.  No sustained ventricular tachycardia need to be assessed and we do not wait for coronary CT angio to make sure he does not have any inducible ischemia.   Medication Adjustments/Labs and Tests Ordered: Current medicines are reviewed at length with the patient today.  Concerns regarding medicines are outlined above.  No orders of the defined types were placed in this encounter.  Medication changes: No orders of the defined types were placed in this encounter.   Signed, Park Liter, MD, [facility] 06/13/2020 3:56 PM    [place]

## 2020-06-13 NOTE — Patient Instructions (Signed)

## 2020-06-14 LAB — BASIC METABOLIC PANEL
BUN/Creatinine Ratio: 13 (ref 10–24)
BUN: 14 mg/dL (ref 8–27)
CO2: 22 mmol/L (ref 20–29)
Calcium: 9.1 mg/dL (ref 8.6–10.2)
Chloride: 103 mmol/L (ref 96–106)
Creatinine, Ser: 1.06 mg/dL (ref 0.76–1.27)
GFR calc Af Amer: 77 mL/min/{1.73_m2} (ref >59)
GFR calc non Af Amer: 66 mL/min/{1.73_m2} (ref >59)
Glucose: 106 mg/dL — ABNORMAL HIGH (ref 65–99)
Potassium: 4.5 mmol/L (ref 3.5–5.2)
Sodium: 137 mmol/L (ref 134–144)

## 2020-06-16 DIAGNOSIS — N481 Balanitis: Secondary | ICD-10-CM | POA: Diagnosis not present

## 2020-06-17 DIAGNOSIS — Z1159 Encounter for screening for other viral diseases: Secondary | ICD-10-CM | POA: Diagnosis not present

## 2020-06-18 ENCOUNTER — Other Ambulatory Visit: Payer: Self-pay | Admitting: Cardiology

## 2020-06-18 NOTE — Telephone Encounter (Signed)
Refill sent to pharmacy.   

## 2020-06-19 ENCOUNTER — Telehealth (HOSPITAL_COMMUNITY): Payer: Self-pay | Admitting: Emergency Medicine

## 2020-06-19 NOTE — Telephone Encounter (Signed)
no answering service to leave a VM

## 2020-06-20 ENCOUNTER — Telehealth: Payer: Self-pay | Admitting: Cardiology

## 2020-06-20 ENCOUNTER — Other Ambulatory Visit: Payer: Self-pay

## 2020-06-20 ENCOUNTER — Ambulatory Visit (HOSPITAL_COMMUNITY)
Admission: RE | Admit: 2020-06-20 | Discharge: 2020-06-20 | Disposition: A | Discharge status: Home / Self Care | Payer: PPO | Source: Ambulatory Visit | Attending: Cardiology | Admitting: Cardiology

## 2020-06-20 DIAGNOSIS — I209 Angina pectoris, unspecified: Secondary | ICD-10-CM | POA: Diagnosis not present

## 2020-06-20 DIAGNOSIS — I951 Orthostatic hypotension: Secondary | ICD-10-CM | POA: Diagnosis not present

## 2020-06-20 DIAGNOSIS — K449 Diaphragmatic hernia without obstruction or gangrene: Secondary | ICD-10-CM | POA: Diagnosis not present

## 2020-06-20 DIAGNOSIS — R0609 Other forms of dyspnea: Secondary | ICD-10-CM | POA: Insufficient documentation

## 2020-06-20 DIAGNOSIS — R079 Chest pain, unspecified: Secondary | ICD-10-CM

## 2020-06-20 MED ORDER — IOHEXOL 350 MG/ML SOLN
80.0000 mL | Freq: Once | INTRAVENOUS | Status: AC | PRN
  Administered 2020-06-20: 80 mL via INTRAVENOUS

## 2020-06-20 MED ORDER — NITROGLYCERIN 0.4 MG SL SUBL
0.8000 mg | SUBLINGUAL_TABLET | SUBLINGUAL | Status: DC | PRN
  Administered 2020-06-20: 0.8 mg via SUBLINGUAL

## 2020-06-20 MED ORDER — NITROGLYCERIN 0.4 MG SL SUBL
SUBLINGUAL_TABLET | SUBLINGUAL | Status: AC
  Filled 2020-06-20: qty 2

## 2020-06-20 NOTE — Telephone Encounter (Signed)
Joel Pope is returning [name]'s call.

## 2020-06-20 NOTE — Telephone Encounter (Signed)
Patient wife informed of results per dpr.

## 2020-06-24 DIAGNOSIS — D122 Benign neoplasm of ascending colon: Secondary | ICD-10-CM | POA: Diagnosis not present

## 2020-06-24 DIAGNOSIS — K635 Polyp of colon: Secondary | ICD-10-CM | POA: Diagnosis not present

## 2020-06-24 DIAGNOSIS — D126 Benign neoplasm of colon, unspecified: Secondary | ICD-10-CM | POA: Diagnosis not present

## 2020-06-24 DIAGNOSIS — I4891 Unspecified atrial fibrillation: Secondary | ICD-10-CM | POA: Diagnosis not present

## 2020-06-24 DIAGNOSIS — Z7902 Long term (current) use of antithrombotics/antiplatelets: Secondary | ICD-10-CM | POA: Diagnosis not present

## 2020-06-24 DIAGNOSIS — K648 Other hemorrhoids: Secondary | ICD-10-CM | POA: Diagnosis not present

## 2020-06-24 DIAGNOSIS — K5730 Diverticulosis of large intestine without perforation or abscess without bleeding: Secondary | ICD-10-CM | POA: Diagnosis not present

## 2020-06-24 DIAGNOSIS — Z800 Family history of malignant neoplasm of digestive organs: Secondary | ICD-10-CM | POA: Diagnosis not present

## 2020-06-24 DIAGNOSIS — Z86010 Personal history of colonic polyps: Secondary | ICD-10-CM | POA: Diagnosis not present

## 2020-06-24 DIAGNOSIS — Z79899 Other long term (current) drug therapy: Secondary | ICD-10-CM | POA: Diagnosis not present

## 2020-06-26 DIAGNOSIS — Z23 Encounter for immunization: Secondary | ICD-10-CM | POA: Diagnosis not present

## 2020-07-15 DIAGNOSIS — K449 Diaphragmatic hernia without obstruction or gangrene: Secondary | ICD-10-CM | POA: Diagnosis not present

## 2020-07-15 DIAGNOSIS — Z6824 Body mass index (BMI) 24.0-24.9, adult: Secondary | ICD-10-CM | POA: Diagnosis not present

## 2020-07-20 ENCOUNTER — Other Ambulatory Visit: Payer: Self-pay | Admitting: Cardiology

## 2020-07-22 ENCOUNTER — Other Ambulatory Visit: Payer: Self-pay

## 2020-07-22 DIAGNOSIS — K449 Diaphragmatic hernia without obstruction or gangrene: Secondary | ICD-10-CM | POA: Diagnosis not present

## 2020-07-22 DIAGNOSIS — R1010 Upper abdominal pain, unspecified: Secondary | ICD-10-CM | POA: Diagnosis not present

## 2020-07-22 DIAGNOSIS — N401 Enlarged prostate with lower urinary tract symptoms: Secondary | ICD-10-CM | POA: Diagnosis not present

## 2020-07-22 DIAGNOSIS — K8020 Calculus of gallbladder without cholecystitis without obstruction: Secondary | ICD-10-CM | POA: Diagnosis not present

## 2020-07-22 DIAGNOSIS — K5730 Diverticulosis of large intestine without perforation or abscess without bleeding: Secondary | ICD-10-CM | POA: Diagnosis not present

## 2020-07-22 DIAGNOSIS — I700 Atherosclerosis of aorta: Secondary | ICD-10-CM | POA: Diagnosis not present

## 2020-07-22 DIAGNOSIS — N400 Enlarged prostate without lower urinary tract symptoms: Secondary | ICD-10-CM | POA: Diagnosis not present

## 2020-07-22 DIAGNOSIS — N3289 Other specified disorders of bladder: Secondary | ICD-10-CM | POA: Diagnosis not present

## 2020-07-22 DIAGNOSIS — N138 Other obstructive and reflux uropathy: Secondary | ICD-10-CM | POA: Diagnosis not present

## 2020-07-22 MED ORDER — FLECAINIDE ACETATE 100 MG PO TABS: 100.0000 mg | ORAL_TABLET | Freq: Two times a day (BID) | ORAL | 0 refills | Status: DC

## 2020-07-31 DIAGNOSIS — R1010 Upper abdominal pain, unspecified: Secondary | ICD-10-CM

## 2020-07-31 HISTORY — DX: Upper abdominal pain, unspecified: R10.10

## 2020-08-11 ENCOUNTER — Other Ambulatory Visit: Payer: Self-pay | Admitting: Cardiology

## 2020-08-12 ENCOUNTER — Telehealth: Payer: Self-pay | Admitting: Cardiology

## 2020-08-12 ENCOUNTER — Other Ambulatory Visit: Payer: Self-pay | Admitting: Cardiology

## 2020-08-12 DIAGNOSIS — H524 Presbyopia: Secondary | ICD-10-CM | POA: Diagnosis not present

## 2020-08-12 MED ORDER — FLECAINIDE ACETATE 100 MG PO TABS
100.0000 mg | ORAL_TABLET | Freq: Two times a day (BID) | ORAL | 3 refills | Status: DC
Start: 2020-08-12 — End: 2021-10-26

## 2020-08-12 NOTE — Telephone Encounter (Signed)
Refill sent in per request.  

## 2020-08-12 NOTE — Telephone Encounter (Signed)
*  STAT* If patient is at the pharmacy, call can be transferred to refill team.   1. Which medications need to be refilled? (please list name of each medication and dose if known)  flecainide (TAMBOCOR) 100 MG tablet  2. Which pharmacy/location (including street and city if local pharmacy) is medication to be sent to? [place], [place]  3. Do they need a 30 day or 90 day supply? 90 day supply

## 2020-08-12 NOTE — Telephone Encounter (Signed)
Pt c/o medication issue:  1. Name of Medication:   flecainide (TAMBOCOR) 100 MG tablet   2. How are you currently taking this medication (dosage and times per day)? 1 tablet by mouth two times daily   3. Are you having a reaction (difficulty breathing--STAT)? No   4. What is your medication issue? Amere is calling stating he has issues every time he needs a refill on this medication due to it still being prescribed by Dr. Curt Bears. Jarriel states Dr. Agustin Cree advised him he no longer needs to follow up with Dr. Curt Bears at this time and stated he would refer him back over if need be. Due to this Kharson is wanting Dr. Agustin Cree to write a new prescription for this medication because the pharmacy continues to inform him it cannot be filled until he sees Dr. Curt Bears again. A refill request has been sent to the [place] refill pool for this medication due to him running out of medication by 08/14/20. Please advise.

## 2020-08-12 NOTE — Telephone Encounter (Signed)
Refill has already been called in today.

## 2020-09-02 DIAGNOSIS — N481 Balanitis: Secondary | ICD-10-CM | POA: Diagnosis not present

## 2020-09-02 DIAGNOSIS — R3912 Poor urinary stream: Secondary | ICD-10-CM | POA: Diagnosis not present

## 2020-09-04 DIAGNOSIS — I480 Paroxysmal atrial fibrillation: Secondary | ICD-10-CM | POA: Diagnosis not present

## 2020-09-04 DIAGNOSIS — K8020 Calculus of gallbladder without cholecystitis without obstruction: Secondary | ICD-10-CM | POA: Diagnosis not present

## 2020-09-04 DIAGNOSIS — K449 Diaphragmatic hernia without obstruction or gangrene: Secondary | ICD-10-CM

## 2020-09-04 HISTORY — DX: Diaphragmatic hernia without obstruction or gangrene: K44.9

## 2020-09-23 DIAGNOSIS — Z0000 Encounter for general adult medical examination without abnormal findings: Secondary | ICD-10-CM | POA: Diagnosis not present

## 2020-09-23 DIAGNOSIS — I480 Paroxysmal atrial fibrillation: Secondary | ICD-10-CM | POA: Diagnosis not present

## 2020-09-23 DIAGNOSIS — K219 Gastro-esophageal reflux disease without esophagitis: Secondary | ICD-10-CM | POA: Diagnosis not present

## 2020-09-23 DIAGNOSIS — Z6824 Body mass index (BMI) 24.0-24.9, adult: Secondary | ICD-10-CM | POA: Diagnosis not present

## 2020-11-11 DIAGNOSIS — Z01818 Encounter for other preprocedural examination: Secondary | ICD-10-CM | POA: Diagnosis not present

## 2020-11-11 DIAGNOSIS — H25813 Combined forms of age-related cataract, bilateral: Secondary | ICD-10-CM | POA: Diagnosis not present

## 2020-11-11 DIAGNOSIS — H25811 Combined forms of age-related cataract, right eye: Secondary | ICD-10-CM | POA: Diagnosis not present

## 2020-11-11 DIAGNOSIS — H353131 Nonexudative age-related macular degeneration, bilateral, early dry stage: Secondary | ICD-10-CM | POA: Diagnosis not present

## 2020-11-14 ENCOUNTER — Ambulatory Visit: Payer: PPO | Admitting: Cardiology

## 2020-11-14 ENCOUNTER — Other Ambulatory Visit: Payer: Self-pay

## 2020-11-14 ENCOUNTER — Encounter: Payer: Self-pay | Admitting: Cardiology

## 2020-11-14 VITALS — BP 126/74 | HR 78 | Ht 76.0 in | Wt 193.0 lb

## 2020-11-14 DIAGNOSIS — I951 Orthostatic hypotension: Secondary | ICD-10-CM | POA: Diagnosis not present

## 2020-11-14 DIAGNOSIS — R0600 Dyspnea, unspecified: Secondary | ICD-10-CM

## 2020-11-14 DIAGNOSIS — R0789 Other chest pain: Secondary | ICD-10-CM | POA: Diagnosis not present

## 2020-11-14 DIAGNOSIS — R0609 Other forms of dyspnea: Secondary | ICD-10-CM

## 2020-11-14 DIAGNOSIS — I480 Paroxysmal atrial fibrillation: Secondary | ICD-10-CM | POA: Diagnosis not present

## 2020-11-14 DIAGNOSIS — R002 Palpitations: Secondary | ICD-10-CM | POA: Diagnosis not present

## 2020-11-14 NOTE — Progress Notes (Signed)
Cardiology Office Note:    Date:  11/14/2020   ID:  Diante Barley, DOB February 24, 1941, MRN [id number]  PCP:  Ronita Hipps, MD  Cardiologist:  Jenne Campus, MD    Referring MD: Ronita Hipps, MD   No chief complaint on file. I am doing much better  History of Present Illness:    Deivi Huckins is a 80 y.o. male with past medical history significant for paroxysmal atrial fibrillation, orthostatic hypotension, palpitations.  Last time I seen him he gets some peculiar symptoms we could not quite figure out exactly what was the problem, coronary CT angio was done which showed normal coronaries.  Interestingly he cannot down flecainide by half by himself and since that time he is doing dramatically better he did experiment he try to go back on 100 mg twice a day still having again the same symptoms so eventually end up taking only 50 mg twice daily he is doing well.  Denies have any palpitations he is good.  Past Medical History:  Diagnosis Date  . Atrial fibrillation ([place]) 05/23/2018  . Atypical chest pain 01/09/2019  . BPH (benign prostatic hyperplasia) 05/09/2017  . Dyspnea on exertion 04/29/2020  . Gallstones 12/29/2017  . Hiatal hernia 09/04/2020  . Orthostatic hypotension 05/09/2017  . Pain of upper abdomen 07/31/2020  . Palpitations 05/09/2017    Past Surgical History:  Procedure Laterality Date  . HEMORROIDECTOMY    . ROTATOR CUFF REPAIR      Current Medications: Current Meds  Medication Sig  . ELIQUIS 5 MG TABS tablet Take 1 tablet (5 mg total) by mouth 2 (two) times daily.  . flecainide (TAMBOCOR) 100 MG tablet Take 1 tablet (100 mg total) by mouth 2 (two) times daily. Please make overdue appt with Dr. Curt Bears before anymore refills. Thank you 2nd attempt  . Glucosamine-Chondroit-Vit C-Mn (GLUCOSAMINE CHONDR 1500 COMPLX PO) Take 1 tablet by mouth 2 (two) times daily.   Marland Kitchen omeprazole (PRILOSEC) 40 MG capsule TAKE ONE CAPSULE BY MOUTH DAILY MUST HAVE APPOINTMENT  . tamsulosin  (FLOMAX) 0.4 MG CAPS capsule 0.4 mg 2 (two) times daily.  Marland Kitchen triamcinolone cream (KENALOG) 0.1 % Apply 1 application topically daily as needed (rash). Use as directed for affected area.     Allergies:   Patient has no known allergies.   Social History   Socioeconomic History  . Marital status: Married    Spouse name: Not on file  . Number of children: Not on file  . Years of education: Not on file  . Highest education level: Not on file  Occupational History  . Not on file  Tobacco Use  . Smoking status: Former Research scientist (life sciences)  . Smokeless tobacco: Never Used  . Tobacco comment: quit 35 years ago  Vaping Use  . Vaping Use: Never used  Substance and Sexual Activity  . Alcohol use: Yes    Comment: occassional drink  . Drug use: No  . Sexual activity: Not on file  Other Topics Concern  . Not on file  Social History Narrative  . Not on file   Social Determinants of Health   Financial Resource Strain: Not on file  Food Insecurity: Not on file  Transportation Needs: Not on file  Physical Activity: Not on file  Stress: Not on file  Social Connections: Not on file     Family History: The patient's family history includes Colon cancer in his mother; Diabetes in his father; Heart Problems in his father; Liver cancer in his mother;  Parkinson's disease in his sister. ROS:   Please see the history of present illness.    All 14 point review of systems negative except as described per history of present illness  EKGs/Labs/Other Studies Reviewed:      Recent Labs: 06/13/2020: BUN 14; Creatinine, Ser 1.06; Potassium 4.5; Sodium 137  Recent Lipid Panel No results found for: CHOL, TRIG, HDL, CHOLHDL, VLDL, LDLCALC, LDLDIRECT  Physical Exam:    VS:  BP 126/74 (BP Location: Right Arm, Patient Position: Sitting)   Pulse 78   Ht 6\' 4"  (1.93 m)   Wt 193 lb (87.5 kg)   SpO2 94%   BMI 23.49 kg/m     Wt Readings from Last 3 Encounters:  11/14/20 193 lb (87.5 kg)  06/13/20 188 lb (85.3  kg)  04/29/20 192 lb 3.2 oz (87.2 kg)     GEN:  Well nourished, well developed in no acute distress HEENT: Normal NECK: No JVD; No carotid bruits LYMPHATICS: No lymphadenopathy CARDIAC: RRR, no murmurs, no rubs, no gallops RESPIRATORY:  Clear to auscultation without rales, wheezing or rhonchi  ABDOMEN: Soft, non-tender, non-distended MUSCULOSKELETAL:  No edema; No deformity  SKIN: Warm and dry LOWER EXTREMITIES: no swelling NEUROLOGIC:  Alert and oriented x 3 PSYCHIATRIC:  Normal affect   ASSESSMENT:    1. Paroxysmal atrial fibrillation (HCC)   2. Orthostatic hypotension   3. Palpitations   4. Dyspnea on exertion   5. Atypical chest pain    PLAN:    In order of problems listed above:  1. Paroxysmal atrial fibrillation maintaining sinus rhythm based on EKG today, he is anticoagulant which I will continue.  His arrhythmia is controlled with flecainide which he recently reduced only 50 mg twice a day.  I advised him to continue the doses he is feeling well. 2. Orthostatic hypotension denies having any recent symptoms. 3. Palpitations gone after reduction of his flecainide. 4. Atypical chest pain coronary CT angio analyzed showing no significant stenosis with normal anatomy.   Medication Adjustments/Labs and Tests Ordered: Current medicines are reviewed at length with the patient today.  Concerns regarding medicines are outlined above.  Orders Placed This Encounter  Procedures  . EKG 12-Lead   Medication changes: No orders of the defined types were placed in this encounter.   Signed, Park Liter, MD, [facility] 11/14/2020 10:31 AM    [place]

## 2020-11-14 NOTE — Patient Instructions (Signed)

## 2020-12-08 DIAGNOSIS — J329 Chronic sinusitis, unspecified: Secondary | ICD-10-CM | POA: Diagnosis not present

## 2020-12-19 ENCOUNTER — Other Ambulatory Visit: Payer: Self-pay | Admitting: Cardiology

## 2020-12-19 NOTE — Telephone Encounter (Signed)
Prescription refill request for Eliquis received. Indication:atrial fib Last office visit:3/22 krasowski Scr:1.0  10/21 Age: 80 Weight:87.5 kg  Prescription refilled

## 2021-01-20 DIAGNOSIS — Z87891 Personal history of nicotine dependence: Secondary | ICD-10-CM | POA: Diagnosis not present

## 2021-01-20 DIAGNOSIS — H259 Unspecified age-related cataract: Secondary | ICD-10-CM | POA: Diagnosis not present

## 2021-01-20 DIAGNOSIS — I4891 Unspecified atrial fibrillation: Secondary | ICD-10-CM | POA: Diagnosis not present

## 2021-01-20 DIAGNOSIS — H25811 Combined forms of age-related cataract, right eye: Secondary | ICD-10-CM | POA: Diagnosis not present

## 2021-01-20 DIAGNOSIS — Z7901 Long term (current) use of anticoagulants: Secondary | ICD-10-CM | POA: Diagnosis not present

## 2021-01-20 DIAGNOSIS — K219 Gastro-esophageal reflux disease without esophagitis: Secondary | ICD-10-CM | POA: Diagnosis not present

## 2021-01-20 HISTORY — PX: OTHER SURGICAL HISTORY: SHX169

## 2021-02-10 DIAGNOSIS — N481 Balanitis: Secondary | ICD-10-CM | POA: Diagnosis not present

## 2021-02-10 DIAGNOSIS — R3912 Poor urinary stream: Secondary | ICD-10-CM | POA: Diagnosis not present

## 2021-04-09 DIAGNOSIS — B351 Tinea unguium: Secondary | ICD-10-CM | POA: Diagnosis not present

## 2021-06-08 ENCOUNTER — Ambulatory Visit: Payer: PPO | Admitting: Cardiology

## 2021-06-10 ENCOUNTER — Encounter: Payer: Self-pay | Admitting: Cardiology

## 2021-06-10 ENCOUNTER — Other Ambulatory Visit: Payer: Self-pay

## 2021-06-10 ENCOUNTER — Ambulatory Visit: Payer: PPO | Admitting: Cardiology

## 2021-06-10 VITALS — BP 152/70 | HR 72 | Ht 76.0 in | Wt 188.0 lb

## 2021-06-10 DIAGNOSIS — I951 Orthostatic hypotension: Secondary | ICD-10-CM

## 2021-06-10 DIAGNOSIS — R0789 Other chest pain: Secondary | ICD-10-CM

## 2021-06-10 DIAGNOSIS — R0600 Dyspnea, unspecified: Secondary | ICD-10-CM | POA: Diagnosis not present

## 2021-06-10 DIAGNOSIS — R002 Palpitations: Secondary | ICD-10-CM

## 2021-06-10 DIAGNOSIS — I480 Paroxysmal atrial fibrillation: Secondary | ICD-10-CM | POA: Diagnosis not present

## 2021-06-10 DIAGNOSIS — R0609 Other forms of dyspnea: Secondary | ICD-10-CM

## 2021-06-10 NOTE — Patient Instructions (Signed)

## 2021-06-10 NOTE — Progress Notes (Signed)
Cardiology Office Note:    Date:  06/10/2021   ID:  Joel Pope, DOB Jul 11, 1941, MRN [id number]  PCP:  Ronita Hipps, MD  Cardiologist:  Jenne Campus, MD    Referring MD: Ronita Hipps, MD   Chief Complaint  Patient presents with   low bp  But drop in blood pressure when I get up  History of Present Illness:    Joel Pope is a 80 y.o. male with past medical history significant for paroxysmal atrial fibrillation successfully suppressed with amiodarone, anticoagulated, orthostatic hypotension, palpitations, prostate problem.  Last time when he was in my office he described fairly peculiar sensation he said when he bent forward he was uncomfortable in the chest he have to bend forward special way to put some pressure on his belly and then get up and especially right.  He learned how to do it and now he does have any problem with this he also describes some dizziness when he gets up very quickly.  Is been going on for a while already.  He did see some surgeon with concern about hiatal hernia however he was told is not big enough to do any interventions.  He also last time decided to cut down his flecainide from 100 mg twice daily to only at 50 mg twice daily because he simply felt better with this but then later he started back on 100 mg twice daily and can tolerate this with no problem.  Overall doing well but does have what appears to be orthostatic hypotension he is taking Flomax because of prostate problem.  Denies having any palpitations.  Past Medical History:  Diagnosis Date   Atrial fibrillation ([place]) 05/23/2018   Atypical chest pain 01/09/2019   BPH (benign prostatic hyperplasia) 05/09/2017   Dyspnea on exertion 04/29/2020   Gallstones 12/29/2017   Hiatal hernia 09/04/2020   Orthostatic hypotension 05/09/2017   Pain of upper abdomen 07/31/2020   Palpitations 05/09/2017    Past Surgical History:  Procedure Laterality Date   HEMORROIDECTOMY     R eye cataract removal   01/20/2021   ROTATOR CUFF REPAIR      Current Medications: Current Meds  Medication Sig   ELIQUIS 5 MG TABS tablet Take 1 tablet (5 mg total) by mouth 2 (two) times daily. (Patient taking differently: Take 5 mg by mouth 2 (two) times daily.)   flecainide (TAMBOCOR) 100 MG tablet Take 1 tablet (100 mg total) by mouth 2 (two) times daily. Please make overdue appt with Dr. Curt Bears before anymore refills. Thank you 2nd attempt   Glucosamine-Chondroit-Vit C-Mn (GLUCOSAMINE CHONDR 1500 COMPLX PO) Take 1 tablet by mouth 2 (two) times daily.    omeprazole (PRILOSEC) 40 MG capsule Take 40 mg by mouth daily.   tamsulosin (FLOMAX) 0.4 MG CAPS capsule Take 0.4 mg by mouth 2 (two) times daily.   triamcinolone cream (KENALOG) 0.1 % Apply 1 application topically daily as needed (rash). Use as directed for affected area.     Allergies:   Patient has no known allergies.   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   Vaping Use: Never used  Substance and Sexual Activity   Alcohol use: Yes    Comment: occassional drink   Drug use: No   Sexual activity: Not on file  Other Topics Concern   Not on file  Social History Narrative   Not on file   Social Determinants of Health   Financial Resource Strain: Not on file  Food Insecurity: Not on file  Transportation Needs: Not on file  Physical Activity: Not on file  Stress: Not on file  Social Connections: Not on file     Family History: The patient's family history includes Colon cancer in his mother; Diabetes in his father; Heart Problems in his father; Liver cancer in his mother; Parkinson's disease in his sister. ROS:   Please  see the history of present illness.    All 14 point review of systems negative except as described per history of present illness  EKGs/Labs/Other Studies Reviewed:      Recent Labs: 06/13/2020: BUN 14; Creatinine, Ser 1.06; Potassium 4.5; Sodium 137  Recent Lipid Panel No results found for: CHOL, TRIG, HDL, CHOLHDL, VLDL, LDLCALC, LDLDIRECT  Physical Exam:    VS:  BP (!) 152/70 (BP Location: Right Arm, Patient Position: Sitting)   Pulse 72   Ht 6\' 4"  (1.93 m)   Wt 188 lb (85.3 kg)   SpO2 97%   BMI 22.88 kg/m     Wt Readings from Last 3 Encounters:  06/10/21 188 lb (85.3 kg)  11/14/20 193 lb (87.5 kg)  06/13/20 188 lb (85.3 kg)     GEN:  Well nourished, well developed in no acute distress HEENT: Normal NECK: No JVD; No carotid bruits LYMPHATICS: No lymphadenopathy CARDIAC: RRR, no murmurs, no rubs, no gallops RESPIRATORY:  Clear to auscultation without rales, wheezing or rhonchi  ABDOMEN: Soft, non-tender, non-distended MUSCULOSKELETAL:  No edema; No deformity  SKIN: Warm and dry LOWER EXTREMITIES: no swelling NEUROLOGIC:  Alert and oriented x 3 PSYCHIATRIC:  Normal affect   ASSESSMENT:    1. Paroxysmal atrial fibrillation (HCC)   2. Orthostatic hypotension   3. Dyspnea on exertion   4. Palpitations   5. Atypical chest pain    PLAN:    In order of problems listed above:  Paroxysmal atrial fibrillation: He is anticoagulated continue he is on flecainide which I will continue he is EKG today showed normal sinus rhythm normal QS complex duration morphology, nonspecific ST segment changes we will continue present management. Orthostatic hypotension we talked about fluid intake we talked about potentially wearing elastic stockings we did talk also about potentially talking to urologist about switching him from Flomax to a different medication that should help with a prostate problem and at this time time will not cause orthostatic hypotension.  Previously he tried  Flomax once a day at any time but that was not sufficient he had difficulty urinating now he seems to be doing well Dyspnea on exertion denies having any he still very active he walks about 3-4 times a week plus he goes to gym 2-3 times a week and he exercise with no difficulties. Palpitations denies having any Dyslipidemia: I did review K PN which show me data from January 2022 with HDL 40 I do not have LDL he scheduled to his primary care physician and he prefer a primary care physician to do his test.   Medication Adjustments/Labs and Tests Ordered: Current medicines are reviewed at length with the patient today.  Concerns regarding medicines are  outlined above.  No orders of the defined types were placed in this encounter.  Medication changes: No orders of the defined types were placed in this encounter.   Signed, Park Liter, MD, [facility] 06/10/2021 1:06 PM    [place]

## 2021-06-18 ENCOUNTER — Other Ambulatory Visit: Payer: Self-pay | Admitting: Cardiology

## 2021-06-18 NOTE — Telephone Encounter (Signed)
Prescription refill request for Eliquis received. Indication:afib Last office visit:krasowski 06/10/21 Scr: 1.300 mg/ 09/23/2020 Age: 33m Weight:85.3kg

## 2021-07-16 ENCOUNTER — Other Ambulatory Visit: Payer: Self-pay

## 2021-07-16 ENCOUNTER — Ambulatory Visit: Payer: PPO | Admitting: Podiatry

## 2021-07-16 ENCOUNTER — Encounter: Payer: Self-pay | Admitting: Podiatry

## 2021-07-16 DIAGNOSIS — L600 Ingrowing nail: Secondary | ICD-10-CM

## 2021-07-16 DIAGNOSIS — M79676 Pain in unspecified toe(s): Secondary | ICD-10-CM | POA: Diagnosis not present

## 2021-07-16 NOTE — Progress Notes (Signed)
  Subjective:  Patient ID: Joel Pope, male    DOB: 1941/04/27,  MRN: [id number]  Chief Complaint  Patient presents with   Nail Problem    The left big toe nail was taken off 2 times and has grown back thick and discolored    80 y.o. male presents with the above complaint. History confirmed with patient.  Objective:  Physical Exam: warm, good capillary refill, no trophic changes or ulcerative lesions, normal DP and PT pulses, and normal sensory exam.  Painful ingrowing nail at  medial border of the right, hallux.  Left hallux nail severe dystrophy with transverse ridging  Assessment:   1. Ingrown nail   2. Pain around toenail      Plan:  Patient was evaluated and treated and all questions answered.  Ingrown Nail, left -Patient elects to proceed with toenail removal today left hallux. Right nail debrided in slant-back fashion -Ingrown nail excised. See procedure note. -Educated on post-procedure care including soaking. Written instructions provided. -Patient to follow up in 2 weeks for nail check.  Procedure: Excision of Ingrown Toenail Location: Left 1st toe  Anesthesia: Lidocaine 1% plain; 8mL, digital block. Skin Prep: Betadine. Dressing: Silvadene; telfa; dry, sterile, compression dressing. Technique: Following skin prep, the toe was exsanguinated and a tourniquet was secured at the base of the toe. The affected nail was freed and excised. Chemical matrixectomy was then performed with phenol and irrigated out with alcohol. The tourniquet was then removed and sterile dressing applied. Disposition: Patient tolerated procedure well. Patient to return in 2 weeks for follow-up.  Return in about 2 weeks (around 07/30/2021) for nail check.

## 2021-07-16 NOTE — Patient Instructions (Signed)

## 2021-07-30 ENCOUNTER — Encounter: Payer: Self-pay | Admitting: Podiatry

## 2021-07-30 ENCOUNTER — Ambulatory Visit: Payer: PPO | Admitting: Podiatry

## 2021-07-30 DIAGNOSIS — M79676 Pain in unspecified toe(s): Secondary | ICD-10-CM

## 2021-07-30 DIAGNOSIS — L600 Ingrowing nail: Secondary | ICD-10-CM | POA: Diagnosis not present

## 2021-07-30 NOTE — Progress Notes (Signed)
  Subjective:  Patient ID: Joel Pope, male    DOB: 1941/03/13,  MRN: [id number]  Chief Complaint  Patient presents with   Nail Problem    The left big toenail was draining last week and still is and the corner of the nail is red    80 y.o. male presents for follow up of nail procedure. History confirmed with patient.   Objective:  Physical Exam: Ingrown nail avulsion site: overlying soft crust, no warmth, no drainage, and no erythema still granulating wound bed with minimal drainage. Assessment:   1. Ingrown nail   2. Pain around toenail    Plan:  Patient was evaluated and treated and all questions answered.  S/p Ingrown Toenail Excision, left -Healing well. Gently curettaged nail corners and nail bed. Advised to soak tonight and then d/c soaks. Apply nesporin and band-aid daily x1 week. If wound not healed in 2 weeks let me know. -Discussed return precautions. -F/u PRN

## 2021-09-01 DIAGNOSIS — N481 Balanitis: Secondary | ICD-10-CM | POA: Diagnosis not present

## 2021-09-01 DIAGNOSIS — R3912 Poor urinary stream: Secondary | ICD-10-CM | POA: Diagnosis not present

## 2021-10-25 ENCOUNTER — Other Ambulatory Visit: Payer: Self-pay | Admitting: Cardiology

## 2021-11-26 DIAGNOSIS — I480 Paroxysmal atrial fibrillation: Secondary | ICD-10-CM | POA: Diagnosis not present

## 2021-11-26 DIAGNOSIS — Z0000 Encounter for general adult medical examination without abnormal findings: Secondary | ICD-10-CM | POA: Diagnosis not present

## 2021-11-26 DIAGNOSIS — K219 Gastro-esophageal reflux disease without esophagitis: Secondary | ICD-10-CM | POA: Diagnosis not present

## 2021-11-26 DIAGNOSIS — E785 Hyperlipidemia, unspecified: Secondary | ICD-10-CM | POA: Diagnosis not present

## 2021-11-26 DIAGNOSIS — Z6823 Body mass index (BMI) 23.0-23.9, adult: Secondary | ICD-10-CM | POA: Diagnosis not present

## 2021-11-26 DIAGNOSIS — Z79899 Other long term (current) drug therapy: Secondary | ICD-10-CM | POA: Diagnosis not present

## 2021-11-26 DIAGNOSIS — Z1331 Encounter for screening for depression: Secondary | ICD-10-CM | POA: Diagnosis not present

## 2021-12-02 DIAGNOSIS — Z1211 Encounter for screening for malignant neoplasm of colon: Secondary | ICD-10-CM | POA: Diagnosis not present

## 2021-12-25 ENCOUNTER — Other Ambulatory Visit: Payer: Self-pay | Admitting: Cardiology

## 2021-12-25 NOTE — Telephone Encounter (Signed)
Prescription refill request for Eliquis received. ?Indication:Afib ?Last office visit:9/22 ?Scr:1.2 ?Age: 81 ?Weight:85.3 kg ? ?Prescription refilled ? ?

## 2021-12-30 DIAGNOSIS — B9681 Helicobacter pylori [H. pylori] as the cause of diseases classified elsewhere: Secondary | ICD-10-CM | POA: Diagnosis not present

## 2021-12-30 DIAGNOSIS — K219 Gastro-esophageal reflux disease without esophagitis: Secondary | ICD-10-CM | POA: Diagnosis not present

## 2021-12-30 DIAGNOSIS — K2980 Duodenitis without bleeding: Secondary | ICD-10-CM | POA: Diagnosis not present

## 2021-12-30 DIAGNOSIS — K5790 Diverticulosis of intestine, part unspecified, without perforation or abscess without bleeding: Secondary | ICD-10-CM | POA: Diagnosis not present

## 2021-12-30 DIAGNOSIS — K921 Melena: Secondary | ICD-10-CM | POA: Diagnosis not present

## 2021-12-30 DIAGNOSIS — R1310 Dysphagia, unspecified: Secondary | ICD-10-CM | POA: Diagnosis not present

## 2021-12-30 DIAGNOSIS — R195 Other fecal abnormalities: Secondary | ICD-10-CM | POA: Diagnosis not present

## 2022-01-26 DIAGNOSIS — I4891 Unspecified atrial fibrillation: Secondary | ICD-10-CM | POA: Diagnosis not present

## 2022-01-26 DIAGNOSIS — D122 Benign neoplasm of ascending colon: Secondary | ICD-10-CM | POA: Diagnosis not present

## 2022-01-26 DIAGNOSIS — Z800 Family history of malignant neoplasm of digestive organs: Secondary | ICD-10-CM | POA: Diagnosis not present

## 2022-01-26 DIAGNOSIS — D509 Iron deficiency anemia, unspecified: Secondary | ICD-10-CM | POA: Diagnosis not present

## 2022-01-26 DIAGNOSIS — K37 Unspecified appendicitis: Secondary | ICD-10-CM | POA: Diagnosis not present

## 2022-01-26 DIAGNOSIS — K449 Diaphragmatic hernia without obstruction or gangrene: Secondary | ICD-10-CM | POA: Diagnosis not present

## 2022-01-26 DIAGNOSIS — K635 Polyp of colon: Secondary | ICD-10-CM | POA: Diagnosis not present

## 2022-01-26 DIAGNOSIS — Z7901 Long term (current) use of anticoagulants: Secondary | ICD-10-CM | POA: Diagnosis not present

## 2022-01-26 DIAGNOSIS — K5730 Diverticulosis of large intestine without perforation or abscess without bleeding: Secondary | ICD-10-CM | POA: Diagnosis not present

## 2022-01-26 DIAGNOSIS — K921 Melena: Secondary | ICD-10-CM | POA: Diagnosis not present

## 2022-01-26 DIAGNOSIS — D649 Anemia, unspecified: Secondary | ICD-10-CM | POA: Diagnosis not present

## 2022-01-26 DIAGNOSIS — K317 Polyp of stomach and duodenum: Secondary | ICD-10-CM | POA: Diagnosis not present

## 2022-01-26 DIAGNOSIS — R1310 Dysphagia, unspecified: Secondary | ICD-10-CM | POA: Diagnosis not present

## 2022-03-09 IMAGING — CT CT HEART MORP W/ CTA COR W/ SCORE W/ CA W/CM &/OR W/O CM
4 of 7 series · 8 of 20 positions shown, 9 images · IV contrast (APPLIED)
Comparison: None.

Addendum:
CLINICAL DATA: 79M with atrial fibrillation, NSVT and chest pain.

EXAM:
Cardiac/Coronary  CT
TECHNIQUE: The patient was scanned on a [employer] Force scanner.

[Series 6: best diast 72 % · axial · 0.42mm/px · z∈[+1339,+1394]mm · 2 of 412 slices shown, 3 images]
[im 138/412  vessel]
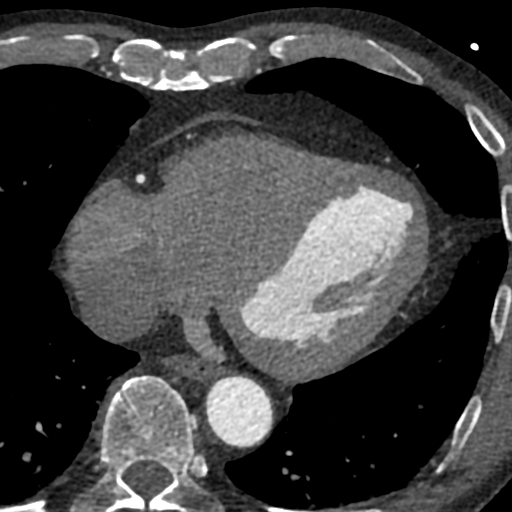
[im 138/412  lung]
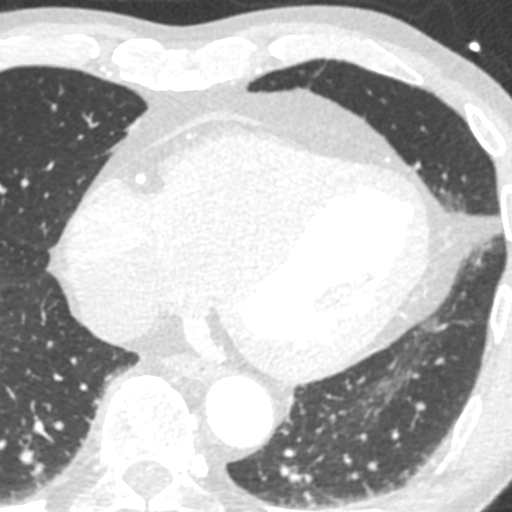
[im 275/412  vessel]
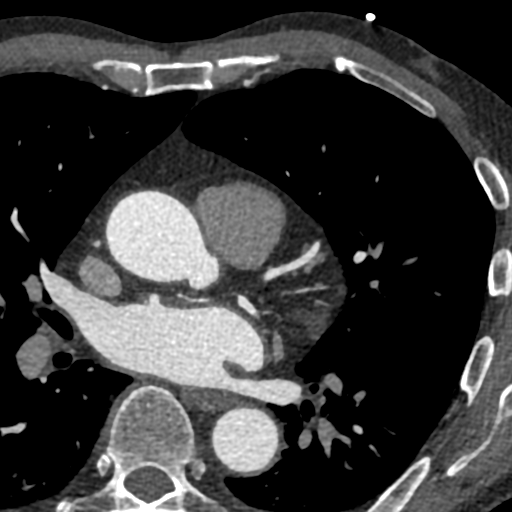

[Series 7: best syst 31 % · axial · 0.42mm/px · z∈[+1339,+1394]mm · 2 of 412 slices shown]
[im 138/412  vessel]
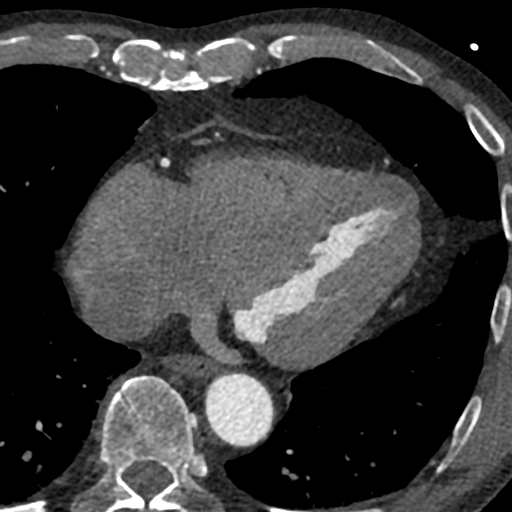
[im 275/412  vessel]
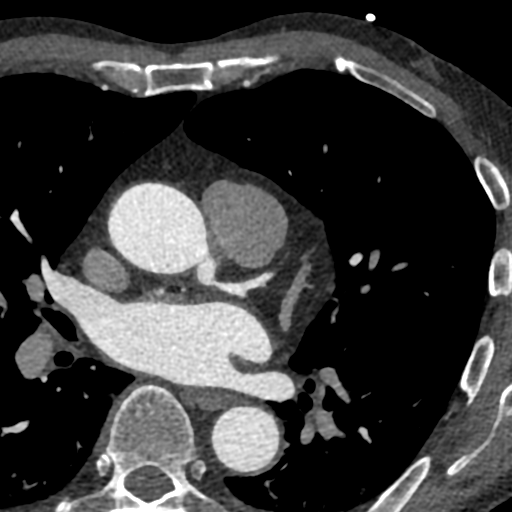

[Series 8: ts diast sharp 72 % · axial · 0.42mm/px · z∈[+1339,+1394]mm · 2 of 412 slices shown]
[im 138/412  lung]
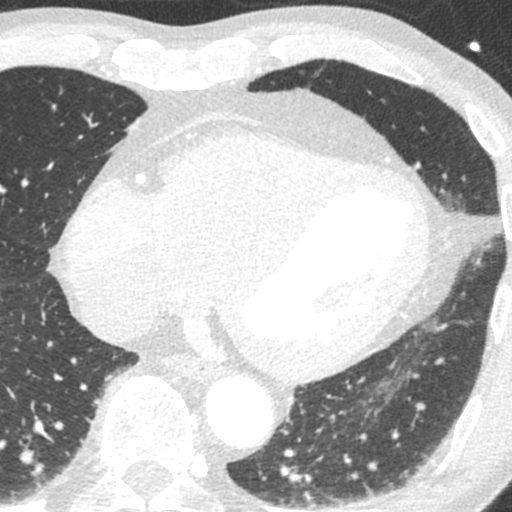
[im 275/412  lung]
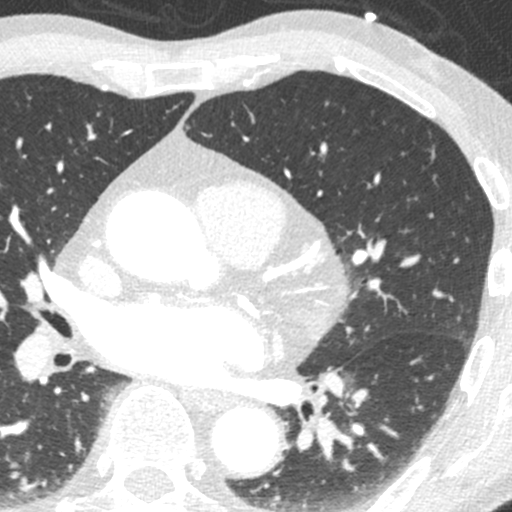

[Series 9: ts syst sharp 31 % · axial · 0.42mm/px · z∈[+1339,+1394]mm · 2 of 412 slices shown]
[im 138/412  lung]
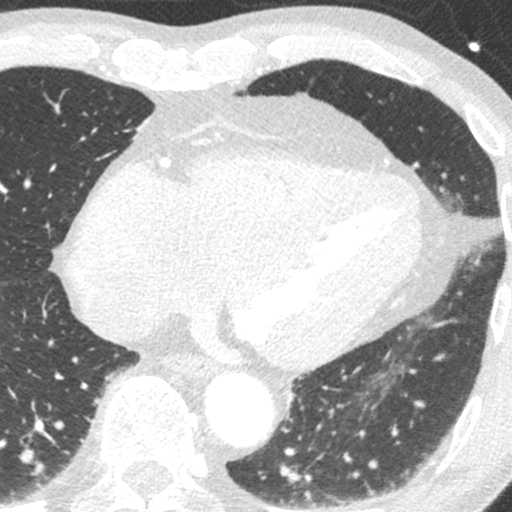
[im 275/412  lung]
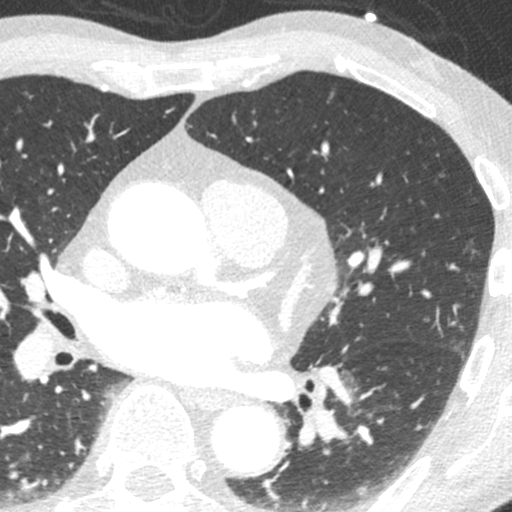

[8 of 20 positions shown; findings below may reference images not displayed]



Aorta: Normal size. Ascending aorta 3.7 cm. No calcifications. No
dissection.

Aortic Valve:  Trileaflet.  No calcifications.

Coronary Arteries:  Normal coronary origin.  Right dominance.

RCA is a large dominant artery that gives rise to PDA and 3 WOODS
branches. There is no plaque.

Left main is a large artery that gives rise to LAD and LCX arteries.

LAD is a large vessel that has no plaque. There is a small D1,
normal D2 and small D3 without plaque.

LCX is a non-dominant artery that gives rise to a tiny OM1, small
OM2 and large OM3. There is no plaque.

Other findings:

Normal pulmonary vein drainage into the left atrium.

Normal let atrial appendage without a thrombus.

Normal size of the pulmonary artery.
IMPRESSION: 1. Coronary calcium score of 0. This was 0 percentile for age and
sex matched control.

2. Normal coronary origin with right dominance.

3. No evidence of CAD.

4.  Consider non-cardiac causes of chest pain.

Interpretation of the non-cardiac thoracic cavity is pending by

EXAM:
OVER-READ INTERPRETATION  CT CHEST

The following report is an over-read performed by radiologist Dr.
over-read does not include interpretation of cardiac or coronary
anatomy or pathology. The coronary CTA and coronary calcium score.
Interpretation by the cardiologist is attached.
FINDINGS: Vascular: No acute abnormality identified.

Mediastinum/nodes: No mediastinal or hilar adenopathy. No mass
identified. Small hiatal hernia noted.

Lungs/pleura: No pleural effusion, airspace consolidation or
atelectasis. Mild linear scarring identified within the lower lobes.

Upper abdomen: No acute abnormality.

Musculoskeletal: Mild degenerative disc disease. No acute or
suspicious osseous findings.
IMPRESSION: 1. No significant supplemental noncardiac findings identified.
2. Small hiatal hernia.

*** End of Addendum ***
FINDINGS: A 120 kV prospective scan was triggered in the descending thoracic
aorta at 111 HU's. Axial non-contrast 3 mm slices were carried out
through the heart. The data set was analyzed on a dedicated work
station and scored using the Agatson method. Gantry rotation speed
was 250 msecs and collimation was .6 mm. No beta blockade and 0.8 mg
of sl NTG was given. The 3D data set was reconstructed in 5%
intervals of the 67-82 % of the R-R cycle. Diastolic phases were
analyzed on a dedicated work station using MPR, MIP and VRT modes.
The patient received 80 cc of contrast.

Aorta: Normal size. Ascending aorta 3.7 cm. No calcifications. No
dissection.

Aortic Valve:  Trileaflet.  No calcifications.

Coronary Arteries:  Normal coronary origin.  Right dominance.

RCA is a large dominant artery that gives rise to PDA and 3 WOODS
branches. There is no plaque.

Left main is a large artery that gives rise to LAD and LCX arteries.

LAD is a large vessel that has no plaque. There is a small D1,
normal D2 and small D3 without plaque.

LCX is a non-dominant artery that gives rise to a tiny OM1, small
OM2 and large OM3. There is no plaque.

Other findings:

Normal pulmonary vein drainage into the left atrium.

Normal let atrial appendage without a thrombus.

Normal size of the pulmonary artery.
IMPRESSION: 1. Coronary calcium score of 0. This was 0 percentile for age and
sex matched control.

2. Normal coronary origin with right dominance.

3. No evidence of CAD.

4.  Consider non-cardiac causes of chest pain.

Interpretation of the non-cardiac thoracic cavity is pending by

## 2022-03-31 DIAGNOSIS — B9681 Helicobacter pylori [H. pylori] as the cause of diseases classified elsewhere: Secondary | ICD-10-CM | POA: Diagnosis not present

## 2022-03-31 DIAGNOSIS — R1310 Dysphagia, unspecified: Secondary | ICD-10-CM | POA: Diagnosis not present

## 2022-03-31 DIAGNOSIS — K219 Gastro-esophageal reflux disease without esophagitis: Secondary | ICD-10-CM | POA: Diagnosis not present

## 2022-03-31 DIAGNOSIS — K5790 Diverticulosis of intestine, part unspecified, without perforation or abscess without bleeding: Secondary | ICD-10-CM | POA: Diagnosis not present

## 2022-03-31 DIAGNOSIS — D649 Anemia, unspecified: Secondary | ICD-10-CM | POA: Diagnosis not present

## 2022-03-31 DIAGNOSIS — K2980 Duodenitis without bleeding: Secondary | ICD-10-CM | POA: Diagnosis not present

## 2022-04-06 DIAGNOSIS — D649 Anemia, unspecified: Secondary | ICD-10-CM | POA: Diagnosis not present

## 2022-04-06 DIAGNOSIS — D510 Vitamin B12 deficiency anemia due to intrinsic factor deficiency: Secondary | ICD-10-CM | POA: Diagnosis not present

## 2022-06-16 DIAGNOSIS — C44529 Squamous cell carcinoma of skin of other part of trunk: Secondary | ICD-10-CM | POA: Diagnosis not present

## 2022-06-16 DIAGNOSIS — L570 Actinic keratosis: Secondary | ICD-10-CM | POA: Diagnosis not present

## 2022-06-16 DIAGNOSIS — L814 Other melanin hyperpigmentation: Secondary | ICD-10-CM | POA: Diagnosis not present

## 2022-06-16 DIAGNOSIS — L821 Other seborrheic keratosis: Secondary | ICD-10-CM | POA: Diagnosis not present

## 2022-06-29 ENCOUNTER — Other Ambulatory Visit: Payer: Self-pay | Admitting: Cardiology

## 2022-06-29 NOTE — Telephone Encounter (Signed)
Prescription refill request for Eliquis received. Indication: Afib  Last office visit: 06/10/21 Agustin Cree)  Scr:1.2 (11/26/21 via [place])  Age: 81 Weight: 85.3kg  Overdue to see cardiologist. Pt has scheduled appt with Dr Agustin Cree on 07/05/22. Appropriate dose and refill sent to requested pharmacy.

## 2022-07-05 ENCOUNTER — Encounter: Payer: Self-pay | Admitting: Cardiology

## 2022-07-05 ENCOUNTER — Ambulatory Visit: Payer: PPO | Attending: Cardiology | Admitting: Cardiology

## 2022-07-05 VITALS — BP 110/60 | HR 64 | Ht 74.0 in | Wt 182.2 lb

## 2022-07-05 DIAGNOSIS — R002 Palpitations: Secondary | ICD-10-CM | POA: Diagnosis not present

## 2022-07-05 DIAGNOSIS — R0609 Other forms of dyspnea: Secondary | ICD-10-CM

## 2022-07-05 DIAGNOSIS — R0789 Other chest pain: Secondary | ICD-10-CM

## 2022-07-05 DIAGNOSIS — I480 Paroxysmal atrial fibrillation: Secondary | ICD-10-CM | POA: Diagnosis not present

## 2022-07-05 DIAGNOSIS — I951 Orthostatic hypotension: Secondary | ICD-10-CM | POA: Diagnosis not present

## 2022-07-05 NOTE — Addendum Note (Signed)
Addended by: Darrel Reach on: 07/05/2022 04:08 PM   Modules accepted: Orders

## 2022-07-05 NOTE — Progress Notes (Signed)
Cardiology Office Note:    Date:  07/05/2022   ID:  Joel Pope, DOB [birth year], MRN [id number]  PCP:  Ronita Hipps, MD  Cardiologist:  Jenne Campus, MD    Referring MD: Ronita Hipps, MD   Chief Complaint  Patient presents with   Follow-up  Doing well  History of Present Illness:    Joel Pope is a 81 y.o. male with past medical history significant for paroxysmal atrial fibrillation.  We utilize rhythm control strategy with flecainide and now he takes 50 mg twice daily, he is anticoagulated.  Also history of orthostatic hypotension, prostate problem, palpitations.  Last year he got some strange sensations eventually we end up doing coronary CT angio which showed no coronary artery disease, he also had echocardiogram which was normal He is coming today Joel Pope for follow-up.  Overall doing very well.  He denies have any chest pain, tightness, pressure, burning chest, no palpitation dizziness swelling of lower extremities doing well  Past Medical History:  Diagnosis Date   Atrial fibrillation ([place]) 05/23/2018   Atypical chest pain 01/09/2019   BPH (benign prostatic hyperplasia) 05/09/2017   Dyspnea on exertion 04/29/2020   Gallstones 12/29/2017   Hiatal hernia 09/04/2020   Orthostatic hypotension 05/09/2017   Pain of upper abdomen 07/31/2020   Palpitations 05/09/2017    Past Surgical History:  Procedure Laterality Date   HEMORROIDECTOMY     R eye cataract removal  01/20/2021   ROTATOR CUFF REPAIR      Current Medications: Current Meds  Medication Sig   apixaban (ELIQUIS) 5 MG TABS tablet Take 1 tablet (5 mg total) by mouth 2 times daily. (Patient taking differently: Take 5 mg by mouth 2 (two) times daily.)   flecainide (TAMBOCOR) 100 MG tablet Take 1 tablet (100 mg total) by mouth 2 (2) times daily. (Patient taking differently: Take 50 mg by mouth 2 (two) times daily.)   Glucosamine-Chondroit-Vit C-Mn (GLUCOSAMINE CHONDR 1500 COMPLX PO) Take 1 tablet by mouth 2  (two) times daily.    nystatin (MYCOSTATIN/NYSTOP) powder Apply 1 Application topically 2 (two) times daily.   omeprazole (PRILOSEC) 40 MG capsule Take 40 mg by mouth daily.   tamsulosin (FLOMAX) 0.4 MG CAPS capsule Take 0.4 mg by mouth 2 (two) times daily.   triamcinolone cream (KENALOG) 0.1 % Apply 1 application topically daily as needed (rash). Use as directed for affected area.   [DISCONTINUED] fluconazole (DIFLUCAN) 200 MG tablet Take 200 mg by mouth 2 (two) times a week.   [DISCONTINUED] FLUZONE HIGH-DOSE QUADRIVALENT 0.7 ML SUSY    [DISCONTINUED] PFIZER COVID-19 VAC BIVALENT injection      Allergies:   Patient has no known allergies.   Social History   Socioeconomic History   Marital status: Married    Spouse name: Not on file   Number of children: Not on file   Years of education: Not on file   Highest education level: Not on file  Occupational History   Not on file  Tobacco Use   Smoking status: Former   Smokeless tobacco: Never   Tobacco comments:    quit 35 years ago  Vaping Use   Vaping Use: Never used  Substance and Sexual Activity   Alcohol use: Yes    Comment: occassional drink   Drug use: No   Sexual activity: Not on file  Other Topics Concern   Not on file  Social History Narrative   Not on file   Social Determinants of Health  Financial Resource Strain: Not on file  Food Insecurity: Not on file  Transportation Needs: Not on file  Physical Activity: Not on file  Stress: Not on file  Social Connections: Not on file     Family History: The patient's family history includes Colon cancer in his mother; Diabetes in his father; Heart Problems in his father; Liver cancer in his mother; Parkinson's disease in his sister. ROS:   Please see the history of present illness.    All 14 point review of systems negative except as described per history of present illness  EKGs/Labs/Other Studies Reviewed:      Recent Labs: No results found for requested  labs within last 365 days.  Recent Lipid Panel No results found for: "CHOL", "TRIG", "HDL", "CHOLHDL", "VLDL", "LDLCALC", "LDLDIRECT"  Physical Exam:    VS:  BP 110/60 (BP Location: Left Arm, Patient Position: Sitting)   Pulse 64   Ht '6\' 2"'$  (1.88 m)   Wt 182 lb 3.2 oz (82.6 kg)   SpO2 97%   BMI 23.39 kg/m     Wt Readings from Last 3 Encounters:  07/05/22 182 lb 3.2 oz (82.6 kg)  06/10/21 188 lb (85.3 kg)  11/14/20 193 lb (87.5 kg)     GEN:  Well nourished, well developed in no acute distress HEENT: Normal NECK: No JVD; No carotid bruits LYMPHATICS: No lymphadenopathy CARDIAC: RRR, no murmurs, no rubs, no gallops RESPIRATORY:  Clear to auscultation without rales, wheezing or rhonchi  ABDOMEN: Soft, non-tender, non-distended MUSCULOSKELETAL:  No edema; No deformity  SKIN: Warm and dry LOWER EXTREMITIES: no swelling NEUROLOGIC:  Alert and oriented x 3 PSYCHIATRIC:  Normal affect   ASSESSMENT:    1. Paroxysmal atrial fibrillation (HCC)   2. Orthostatic hypotension   3. Palpitations   4. Atypical chest pain   5. Dyspnea on exertion    PLAN:    In order of problems listed above:  Paroxysmal atrial fibrillation EKG done today normal QS complex duration morphology normal QT interval.  Continue flecainide 50 mg twice daily, continue anticoagulation. History of orthostatic hypotension denies have any problem from that side. Palpitations denies having any. Atypical chest pain denies have any problem. Dyslipidemia I did review K PN done in March his total cholesterol 137 HDL 38.  Doing well.   Medication Adjustments/Labs and Tests Ordered: Current medicines are reviewed at length with the patient today.  Concerns regarding medicines are outlined above.  No orders of the defined types were placed in this encounter.  Medication changes: No orders of the defined types were placed in this encounter.   Signed, Park Liter, MD, [facility] 07/05/2022 2:15 PM    [facility]

## 2022-07-05 NOTE — Patient Instructions (Signed)
Medication Instructions:  Your physician recommends that you continue on your current medications as directed. Please refer to the Current Medication list given to you today.  *If you need a refill on your cardiac medications before your next appointment, please call your pharmacy*   Lab Work: None If you have labs (blood work) drawn today and your tests are completely normal, you will receive your results only by: [place] (if you have MyChart) OR A paper copy in the mail If you have any lab test that is abnormal or we need to change your treatment, we will call you to review the results.   Testing/Procedures: None   Follow-Up: At [facility], you and your health needs are our priority.  As part of our continuing mission to provide you with exceptional heart care, we have created designated Provider Care Teams.  These Care Teams include your primary Cardiologist (physician) and Advanced Practice Providers (APPs -  Physician Assistants and Nurse Practitioners) who all work together to provide you with the care you need, when you need it.  We recommend signing up for the patient portal called "MyChart".  Sign up information is provided on this After Visit Summary.  MyChart is used to connect with patients for Virtual Visits (Telemedicine).  Patients are able to view lab/test results, encounter notes, upcoming appointments, etc.  Non-urgent messages can be sent to your provider as well.   To learn more about what you can do with MyChart, go to [email or web address].    Your next appointment:   1 year  The format for your next appointment:   In Person  Provider:   Jenne Campus, MD    Other Instructions   Important Information About Sugar

## 2022-08-17 DIAGNOSIS — R972 Elevated prostate specific antigen [PSA]: Secondary | ICD-10-CM | POA: Diagnosis not present

## 2022-08-24 DIAGNOSIS — N481 Balanitis: Secondary | ICD-10-CM | POA: Diagnosis not present

## 2022-08-24 DIAGNOSIS — R3912 Poor urinary stream: Secondary | ICD-10-CM | POA: Diagnosis not present

## 2022-10-16 ENCOUNTER — Other Ambulatory Visit: Payer: Self-pay | Admitting: Cardiology

## 2022-10-18 NOTE — Telephone Encounter (Signed)
Rx refill sent to pharmacy. 

## 2022-11-17 ENCOUNTER — Other Ambulatory Visit: Payer: Self-pay | Admitting: Cardiology

## 2022-11-17 NOTE — Telephone Encounter (Signed)
Prescription refill request for Eliquis received. Indication: PAF Last office visit: 07/05/22  Nelta Numbers MD Scr: 1.2 on 11/26/21  KPN Age: 82 Weight: 82.6kg  Based on above findings Eliquis '5mg'$  twice daily is the appropriate dose.  Refill approved.

## 2022-12-03 DIAGNOSIS — Z6823 Body mass index (BMI) 23.0-23.9, adult: Secondary | ICD-10-CM | POA: Diagnosis not present

## 2022-12-03 DIAGNOSIS — K219 Gastro-esophageal reflux disease without esophagitis: Secondary | ICD-10-CM | POA: Diagnosis not present

## 2022-12-03 DIAGNOSIS — N400 Enlarged prostate without lower urinary tract symptoms: Secondary | ICD-10-CM | POA: Diagnosis not present

## 2022-12-03 DIAGNOSIS — Z0000 Encounter for general adult medical examination without abnormal findings: Secondary | ICD-10-CM | POA: Diagnosis not present

## 2022-12-03 DIAGNOSIS — Z1331 Encounter for screening for depression: Secondary | ICD-10-CM | POA: Diagnosis not present

## 2022-12-03 DIAGNOSIS — I480 Paroxysmal atrial fibrillation: Secondary | ICD-10-CM | POA: Diagnosis not present

## 2022-12-03 DIAGNOSIS — Z79899 Other long term (current) drug therapy: Secondary | ICD-10-CM | POA: Diagnosis not present

## 2023-01-27 DIAGNOSIS — G5620 Lesion of ulnar nerve, unspecified upper limb: Secondary | ICD-10-CM | POA: Diagnosis not present

## 2023-02-10 DIAGNOSIS — G5620 Lesion of ulnar nerve, unspecified upper limb: Secondary | ICD-10-CM | POA: Diagnosis not present

## 2023-02-10 DIAGNOSIS — G5600 Carpal tunnel syndrome, unspecified upper limb: Secondary | ICD-10-CM | POA: Diagnosis not present

## 2023-04-18 ENCOUNTER — Ambulatory Visit: Payer: PPO | Admitting: Podiatry

## 2023-04-18 DIAGNOSIS — L600 Ingrowing nail: Secondary | ICD-10-CM | POA: Diagnosis not present

## 2023-04-18 NOTE — Patient Instructions (Signed)

## 2023-04-18 NOTE — Progress Notes (Signed)
  Subjective:  Patient ID: Joel Pope, male    DOB: 12-22-1940,  MRN: [id number]  Chief Complaint  Patient presents with   Nail Problem    RIGHT HALLUX POSSIBLE INGROWN, REDNESS, SWOLLEN, DRAINAGE. DENIES N/V/D/C. WAS SEEN 07/2021 FOR THE SAME TOE     82 y.o. male presents with concern for right hallux ingrown nail.  He reports pain redness and swelling around the medial border of the right great toenail.  Previously had been seen for this in November 2022 and a slant back procedure was performed but does not appear to have had full ingrown procedure at that time  Past Medical History:  Diagnosis Date   Atrial fibrillation (HCC) 05/23/2018   Atypical chest pain 01/09/2019   BPH (benign prostatic hyperplasia) 05/09/2017   Dyspnea on exertion 04/29/2020   Gallstones 12/29/2017   Hiatal hernia 09/04/2020   Orthostatic hypotension 05/09/2017   Pain of upper abdomen 07/31/2020   Palpitations 05/09/2017    No Known Allergies  ROS: Negative except as per HPI above  Objective:  General: AAO x3, NAD  Dermatological: Incurvation is present along the bilateral nail border of the right great toe. There is localized edema without any erythema or increase in warmth around the nail border. There is no drainage or pus. There is no ascending cellulitis. No malodor. No open lesions or pre-ulcerative lesions.    Vascular:  Dorsalis Pedis artery and Posterior Tibial artery pedal pulses are 2/4 bilateral.  Capillary fill time < 3 sec to all digits.   Neruologic: Grossly intact via light touch bilateral. Protective threshold intact to all sites bilateral.   Musculoskeletal: No gross boney pedal deformities bilateral. No pain, crepitus, or limitation noted with foot and ankle range of motion bilateral. Muscular strength 5/5 in all groups tested bilateral.  Gait: Unassisted, Nonantalgic.   No images are attached to the encounter.  Assessment:   1. Ingrown nail of great toe of right foot       Plan:  Patient was evaluated and treated and all questions answered.    Ingrown Nail, right -Patient elects to proceed with minor surgery to remove ingrown toenail today. Consent reviewed and signed by patient. -Ingrown nail excised. See procedure note. -Educated on post-procedure care including soaking. Written instructions provided and reviewed. -Patient to follow up in 2 weeks for nail check.  Procedure: Excision of Ingrown Toenail Location: Right 1st toe  bilateral  nail borders. Anesthesia: Lidocaine 1% plain; 1.5 mL and Marcaine 0.5% plain; 1.5 mL, digital block. Skin Prep: Betadine. Dressing: Silvadene; telfa; dry, sterile, compression dressing. Technique: Following skin prep, the toe was exsanguinated and a tourniquet was secured at the base of the toe. The affected nail border was freed, split with a nail splitter, and excised. Chemical matrixectomy was then performed with phenol and irrigated out with alcohol. The tourniquet was then removed and sterile dressing applied. Disposition: Patient tolerated procedure well. Patient to return in 2 weeks for follow-up.    Return in about 2 weeks (around 05/02/2023) for nail check.          Corinna Gab, DPM [facility] / [facility]

## 2023-05-03 ENCOUNTER — Ambulatory Visit: Payer: PPO | Admitting: Podiatry

## 2023-05-03 DIAGNOSIS — L600 Ingrowing nail: Secondary | ICD-10-CM

## 2023-05-03 NOTE — Progress Notes (Signed)
Subjective: Joel Pope is a 82 y.o.  male returns to office today for follow up evaluation after having right Hallux bilateral border nail ingrown removal with phenol and alcohol matrixectomy approximately 2 weeks ago. Patient has been soaking using epsom salts and applying topical antibiotic covered with bandaid daily. Patient denies fevers, chills, nausea, vomiting. Denies any calf pain, chest pain, SOB.   Objective:  Vitals: Reviewed  General: Well developed, nourished, in no acute distress, alert and oriented x3   Dermatology: Skin is warm, dry and supple bilateral. right hallux nail border appears to be clean, dry, with mild granular tissue and surrounding scab. There is no surrounding erythema, edema, drainage/purulence. The remaining nails appear unremarkable at this time. There are no other lesions or other signs of infection present.  Neurovascular status: Intact. No lower extremity swelling; No pain with calf compression bilateral.  Musculoskeletal: Decreased tenderness to palpation of the right hallux nail fold(s). Muscular strength within normal limits bilateral.   Assesement and Plan: S/p phenol and alcohol matrixectomy to the  right hallux nail bilateral, doing well.   -Continue soaking in epsom salts twice a day followed by antibiotic ointment and a band-aid. Can leave uncovered at night. Continue this until completely healed.  -If the area has not healed in 2 weeks, call the office for follow-up appointment, or sooner if any problems arise.  -Monitor for any signs/symptoms of infection. Call the office immediately if any occur or go directly to the emergency room. Call with any questions/concerns.        Corinna Gab, DPM [facility] / [facility]                   05/03/2023

## 2023-06-10 ENCOUNTER — Other Ambulatory Visit: Payer: Self-pay | Admitting: Cardiology

## 2023-08-23 ENCOUNTER — Other Ambulatory Visit: Payer: Self-pay | Admitting: Cardiology

## 2023-08-23 NOTE — Telephone Encounter (Signed)
Prescription refill request for Eliquis received. Indication: PAF Last office visit: 07/05/22  Kandyce Rud MD  (Appt 09/19/22) Scr: 1.10 on 12/03/22  KPN Age: 82 Weight: 82.6kg  Based on above findings Eliquis 5mg  twice daily is the appropriate dose.  Refill approved.

## 2023-09-20 ENCOUNTER — Ambulatory Visit: Payer: PPO | Attending: Cardiology | Admitting: Cardiology

## 2023-09-20 ENCOUNTER — Encounter: Payer: Self-pay | Admitting: Cardiology

## 2023-09-20 VITALS — BP 122/74 | HR 65 | Ht 76.0 in | Wt 184.8 lb

## 2023-09-20 DIAGNOSIS — R0609 Other forms of dyspnea: Secondary | ICD-10-CM

## 2023-09-20 DIAGNOSIS — I951 Orthostatic hypotension: Secondary | ICD-10-CM | POA: Diagnosis not present

## 2023-09-20 DIAGNOSIS — I480 Paroxysmal atrial fibrillation: Secondary | ICD-10-CM | POA: Diagnosis not present

## 2023-09-20 DIAGNOSIS — R002 Palpitations: Secondary | ICD-10-CM

## 2023-09-20 NOTE — Progress Notes (Signed)
 Cardiology Office Note:    Date:  09/20/2023   ID:  Joel Pope, DOB 07/26/1941, MRN [id number]  PCP:  Ina Marcellus RAMAN, MD  Cardiologist:  Lamar Fitch, MD    Referring MD: Ina Marcellus RAMAN, MD   No chief complaint on file.   History of Present Illness:    Joel Pope is a 83 y.o. male  with past medical history significant for paroxysmal atrial fibrillation. We utilize rhythm control strategy with flecainide  and now he takes 50 mg twice daily, he is anticoagulated. Also history of orthostatic hypotension, prostate problem, palpitations. Last year he got some strange sensations eventually we end up doing coronary CT angio which showed no coronary artery disease, he also had echocardiogram which was normal  Comes today to months for follow-up well.  He denies have any chest pain tightness squeezing pressure burning chest no palpitationsSwelling of extremities  Past Medical History:  Diagnosis Date   Atrial fibrillation (HCC) 05/23/2018   Atypical chest pain 01/09/2019   BPH (benign prostatic hyperplasia) 05/09/2017   Dyspnea on exertion 04/29/2020   Gallstones 12/29/2017   Hiatal hernia 09/04/2020   Orthostatic hypotension 05/09/2017   Pain of upper abdomen 07/31/2020   Palpitations 05/09/2017    Past Surgical History:  Procedure Laterality Date   HEMORROIDECTOMY     R eye cataract removal  01/20/2021   ROTATOR CUFF REPAIR      Current Medications: Current Meds  Medication Sig   apixaban  (ELIQUIS ) 5 MG TABS tablet TAKE ONE TABLET BY MOUTH TWICE DAILY   flecainide  (TAMBOCOR ) 100 MG tablet Take 1 tablet (100 mg total) by mouth 2 (2) times daily.   Glucosamine-Chondroit-Vit C-Mn (GLUCOSAMINE CHONDR 1500 COMPLX PO) Take 1 tablet by mouth 2 (two) times daily.    Glucosamine-Chondroitin 500-400 MG CAPS Take 1 capsule by mouth daily.   nystatin (MYCOSTATIN/NYSTOP) powder Apply 1 Application topically 2 (two) times daily.   omeprazole (PRILOSEC) 40 MG capsule Take 40 mg by mouth  daily.   tamsulosin (FLOMAX) 0.4 MG CAPS capsule Take 0.4 mg by mouth 2 (two) times daily.   triamcinolone  cream (KENALOG ) 0.1 % Apply 1 application topically daily as needed (rash). Use as directed for affected area.     Allergies:   Patient has no known allergies.   Social History   Socioeconomic History   Marital status: Married    Spouse name: Not on file   Number of children: Not on file   Years of education: Not on file   Highest education level: Not on file  Occupational History   Not on file  Tobacco Use   Smoking status: Former   Smokeless tobacco: Never   Tobacco comments:    quit 35 years ago  Vaping Use   Vaping status: Never Used  Substance and Sexual Activity   Alcohol use: Yes    Comment: occassional drink   Drug use: No   Sexual activity: Not on file  Other Topics Concern   Not on file  Social History Narrative   Not on file   Social Drivers of Health   Financial Resource Strain: Not on file  Food Insecurity: Not on file  Transportation Needs: Not on file  Physical Activity: Not on file  Stress: Not on file  Social Connections: Not on file     Family History: The patient's family history includes Colon cancer in his mother; Diabetes in his father; Heart Problems in his father; Liver cancer in his mother; Parkinson's disease in his  sister. ROS:   Please see the history of present illness.    All 14 point review of systems negative except as described per history of present illness  EKGs/Labs/Other Studies Reviewed:    EKG Interpretation Date/Time:  Tuesday September 20 2023 10:26:12 EST Ventricular Rate:  65 PR Interval:  184 QRS Duration:  94 QT Interval:  406 QTC Calculation: 422 R Axis:   -44  Text Interpretation: Normal sinus rhythm with sinus arrhythmia Left axis deviation Incomplete right bundle branch block Abnormal ECG No previous ECGs available Confirmed by Bernie [place] [phone]) on 09/20/2023 10:47:50 AM    Recent Labs: No  results found for requested labs within last 365 days.  Recent Lipid Panel No results found for: CHOL, TRIG, HDL, CHOLHDL, VLDL, LDLCALC, LDLDIRECT  Physical Exam:    VS:  BP 122/74   Pulse 65   Ht 6' 4 (1.93 m)   Wt 184 lb 12.8 oz (83.8 kg)   SpO2 97%   BMI 22.49 kg/m     Wt Readings from Last 3 Encounters:  09/20/23 184 lb 12.8 oz (83.8 kg)  07/05/22 182 lb 3.2 oz (82.6 kg)  06/10/21 188 lb (85.3 kg)     GEN:  Well nourished, well developed in no acute distress HEENT: Normal NECK: No JVD; No carotid bruits LYMPHATICS: No lymphadenopathy CARDIAC: RRR, no murmurs, no rubs, no gallops RESPIRATORY:  Clear to auscultation without rales, wheezing or rhonchi  ABDOMEN: Soft, non-tender, non-distended MUSCULOSKELETAL:  No edema; No deformity  SKIN: Warm and dry LOWER EXTREMITIES: no swelling NEUROLOGIC:  Alert and oriented x 3 PSYCHIATRIC:  Normal affect   ASSESSMENT:    1. Paroxysmal atrial fibrillation (HCC)   2. Orthostatic hypotension   3. Dyspnea on exertion   4. Palpitations    PLAN:    In order of problems listed above:  Paroxysmal atrial fibrillation denies having any rhythm control strategy with flecainide -will continue. Orthostatic hypotension.  Denies have any symptoms. Dyspnea exertion denies having any. Palpitations stable.   Medication Adjustments/Labs and Tests Ordered: Current medicines are reviewed at length with the patient today.  Concerns regarding medicines are outlined above.  Orders Placed This Encounter  Procedures   EKG 12-Lead   Medication changes: No orders of the defined types were placed in this encounter.   Signed, [place] DOROTHA Bernie, MD, [facility] 09/20/2023 11:07 AM     [facility]

## 2023-09-20 NOTE — Patient Instructions (Signed)

## 2023-11-15 ENCOUNTER — Other Ambulatory Visit: Payer: Self-pay | Admitting: Cardiology

## 2023-11-15 DIAGNOSIS — N481 Balanitis: Secondary | ICD-10-CM | POA: Diagnosis not present

## 2023-11-15 DIAGNOSIS — Z125 Encounter for screening for malignant neoplasm of prostate: Secondary | ICD-10-CM | POA: Diagnosis not present

## 2023-11-15 DIAGNOSIS — R3912 Poor urinary stream: Secondary | ICD-10-CM | POA: Diagnosis not present

## 2023-12-01 DIAGNOSIS — N401 Enlarged prostate with lower urinary tract symptoms: Secondary | ICD-10-CM | POA: Diagnosis not present

## 2023-12-01 DIAGNOSIS — N451 Epididymitis: Secondary | ICD-10-CM | POA: Diagnosis not present

## 2023-12-01 DIAGNOSIS — R3912 Poor urinary stream: Secondary | ICD-10-CM | POA: Diagnosis not present

## 2023-12-08 DIAGNOSIS — Z0000 Encounter for general adult medical examination without abnormal findings: Secondary | ICD-10-CM | POA: Diagnosis not present

## 2023-12-08 DIAGNOSIS — Z6823 Body mass index (BMI) 23.0-23.9, adult: Secondary | ICD-10-CM | POA: Diagnosis not present

## 2023-12-08 DIAGNOSIS — N400 Enlarged prostate without lower urinary tract symptoms: Secondary | ICD-10-CM | POA: Diagnosis not present

## 2023-12-08 DIAGNOSIS — Z1331 Encounter for screening for depression: Secondary | ICD-10-CM | POA: Diagnosis not present

## 2023-12-08 DIAGNOSIS — I480 Paroxysmal atrial fibrillation: Secondary | ICD-10-CM | POA: Diagnosis not present

## 2023-12-08 DIAGNOSIS — Z1339 Encounter for screening examination for other mental health and behavioral disorders: Secondary | ICD-10-CM | POA: Diagnosis not present

## 2023-12-08 DIAGNOSIS — K219 Gastro-esophageal reflux disease without esophagitis: Secondary | ICD-10-CM | POA: Diagnosis not present

## 2024-04-21 ENCOUNTER — Other Ambulatory Visit: Payer: Self-pay | Admitting: Cardiology

## 2024-04-23 NOTE — Telephone Encounter (Signed)
 Prescription refill request for Eliquis  received. Indication:afib Last office visit:1/25 Scr:1.5  2025 Age: 83 Weight:83.8  kg  Prescription refilled

## 2024-05-03 ENCOUNTER — Telehealth: Payer: Self-pay | Admitting: Cardiology

## 2024-05-03 DIAGNOSIS — I480 Paroxysmal atrial fibrillation: Secondary | ICD-10-CM

## 2024-05-03 MED ORDER — APIXABAN 5 MG PO TABS: 5.0000 mg | ORAL_TABLET | Freq: Two times a day (BID) | ORAL | 0 refills | Status: DC

## 2024-05-03 NOTE — Telephone Encounter (Signed)
*  STAT* If patient is at the pharmacy, call can be transferred to refill team.   1. Which medications need to be refilled? (please list name of each medication and dose if known) ELIQUIS 5 MG TABS tablet  2. Which pharmacy/location (including street and city if local pharmacy) is medication to be sent to? [place], [place]  3. Do they need a 30 day or 90 day supply? [place]

## 2024-05-03 NOTE — Telephone Encounter (Signed)
 Prescription refill request for Eliquis  received. Indication: Afib  Last office visit: 09/20/23 [place])  Scr: 1.5 (12/08/23 via [facility])  Age: 83 Weight: 83.8kg  Appropriate dose. Refill sent.

## 2024-06-26 ENCOUNTER — Ambulatory Visit: Attending: Cardiology | Admitting: Cardiology

## 2024-06-26 ENCOUNTER — Encounter: Payer: Self-pay | Admitting: Cardiology

## 2024-06-26 VITALS — BP 132/80 | HR 61 | Ht 76.0 in | Wt 174.8 lb

## 2024-06-26 DIAGNOSIS — I951 Orthostatic hypotension: Secondary | ICD-10-CM | POA: Diagnosis not present

## 2024-06-26 DIAGNOSIS — R0609 Other forms of dyspnea: Secondary | ICD-10-CM

## 2024-06-26 DIAGNOSIS — I480 Paroxysmal atrial fibrillation: Secondary | ICD-10-CM

## 2024-06-26 NOTE — Progress Notes (Signed)
 Cardiology Office Note:    Date:  06/26/2024   ID:  Joel Pope, DOB 1941/03/29, MRN [id number]  PCP:  Joel Marcellus RAMAN, MD  Cardiologist:  Joel Fitch, MD    Referring MD: Joel Marcellus RAMAN, MD   No chief complaint on file. Doing fine  History of Present Illness:    Joel Pope is a 83 y.o. male past medical history significant for paroxysmal atrial fibrillation twice radiological studies with flecainide , anticoagulated also history of orthostatic hypotension, prostate problem, palpitations.  Last year he did have some strength sensation coronary CT angio was done which showed normal coronaries echocardiogram was normal.  Comes today to months for follow-up.  Overall cardiac wise doing well.  Denies have any chest pain tightness squeezing pressure burning chest.  He is taking care of his sick wife who is also my patient.  Denies have any palpitations  Past Medical History:  Diagnosis Date   Atrial fibrillation (HCC) 05/23/2018   Atypical chest pain 01/09/2019   BPH (benign prostatic hyperplasia) 05/09/2017   Dyspnea on exertion 04/29/2020   Gallstones 12/29/2017   Hiatal hernia 09/04/2020   Orthostatic hypotension 05/09/2017   Pain of upper abdomen 07/31/2020   Palpitations 05/09/2017    Past Surgical History:  Procedure Laterality Date   HEMORROIDECTOMY     R eye cataract removal  01/20/2021   ROTATOR CUFF REPAIR      Current Medications: Current Meds  Medication Sig   apixaban  (ELIQUIS ) 5 MG TABS tablet Take 1 tablet (5 mg total) by mouth 2 (two) times daily.   finasteride (PROSCAR) 5 MG tablet Take 5 mg by mouth daily.   flecainide  (TAMBOCOR ) 100 MG tablet Take 1 tablet (100 mg total) by mouth 2 times daily.   Glucosamine-Chondroit-Vit C-Mn (GLUCOSAMINE CHONDR 1500 COMPLX PO) Take 1 tablet by mouth 2 (two) times daily.    nystatin (MYCOSTATIN/NYSTOP) powder Apply 1 Application topically 2 (two) times daily.   omeprazole (PRILOSEC) 40 MG capsule Take 40 mg by mouth  daily.   tamsulosin (FLOMAX) 0.4 MG CAPS capsule Take 0.4 mg by mouth 2 (two) times daily.   triamcinolone  cream (KENALOG ) 0.1 % Apply 1 application topically daily as needed (rash). Use as directed for affected area.     Allergies:   Patient has no known allergies.   Social History   Socioeconomic History   Marital status: Married    Spouse name: Not on file   Number of children: Not on file   Years of education: Not on file   Highest education level: Not on file  Occupational History   Not on file  Tobacco Use   Smoking status: Former   Smokeless tobacco: Never   Tobacco comments:    quit 35 years ago  Vaping Use   Vaping status: Never Used  Substance and Sexual Activity   Alcohol use: Yes    Comment: occassional drink   Drug use: No   Sexual activity: Not on file  Other Topics Concern   Not on file  Social History Narrative   Not on file   Social Drivers of Health   Financial Resource Strain: Not on file  Food Insecurity: Not on file  Transportation Needs: Not on file  Physical Activity: Not on file  Stress: Not on file  Social Connections: Not on file     Family History: The patient's family history includes Colon cancer in his mother; Diabetes in his father; Heart Problems in his father; Liver cancer in his mother;  Parkinson's disease in his sister. ROS:   Please see the history of present illness.    All 14 point review of systems negative except as described per history of present illness  EKGs/Labs/Other Studies Reviewed:         Recent Labs: No results found for requested labs within last 365 days.  Recent Lipid Panel No results found for: CHOL, TRIG, HDL, CHOLHDL, VLDL, LDLCALC, LDLDIRECT  Physical Exam:    VS:  BP 132/80   Pulse 61   Ht 6' 4 (1.93 m)   Wt 174 lb 12.8 oz (79.3 kg)   SpO2 97%   BMI 21.28 kg/m     Wt Readings from Last 3 Encounters:  06/26/24 174 lb 12.8 oz (79.3 kg)  09/20/23 184 lb 12.8 oz (83.8 kg)   07/05/22 182 lb 3.2 oz (82.6 kg)     GEN:  Well nourished, well developed in no acute distress HEENT: Normal NECK: No JVD; No carotid bruits LYMPHATICS: No lymphadenopathy CARDIAC: RRR, no murmurs, no rubs, no gallops RESPIRATORY:  Clear to auscultation without rales, wheezing or rhonchi  ABDOMEN: Soft, non-tender, non-distended MUSCULOSKELETAL:  No edema; No deformity  SKIN: Warm and dry LOWER EXTREMITIES: no swelling NEUROLOGIC:  Alert and oriented x 3 PSYCHIATRIC:  Normal affect   ASSESSMENT:    1. Paroxysmal atrial fibrillation (HCC)   2. Orthostatic hypotension   3. Dyspnea on exertion    PLAN:    In order of problems listed above:  Paroxysmal atrial fibrillation denies having palpitations will continue monitoring continue anticoagulation. Orthostatic hypotension denies having episodes of dizziness or passing out. Dyspnea on exertion stable actually doing well Concerning his weight loss he does have appointment scheduled with primary care physician and that need to be aggressively investigated.  He thinks that this is related to the fact that he is stressed out about his his wife being sick   Medication Adjustments/Labs and Tests Ordered: Current medicines are reviewed at length with the patient today.  Concerns regarding medicines are outlined above.  Orders Placed This Encounter  Procedures   EKG 12-Lead   Medication changes: No orders of the defined types were placed in this encounter.   Signed, Joel DOROTHA Fitch, MD, [facility] 06/26/2024 3:44 PM    [facility]

## 2024-06-26 NOTE — Patient Instructions (Signed)

## 2024-08-03 DIAGNOSIS — Z23 Encounter for immunization: Secondary | ICD-10-CM | POA: Diagnosis not present

## 2024-09-13 ENCOUNTER — Other Ambulatory Visit: Payer: Self-pay | Admitting: Cardiology

## 2024-09-13 DIAGNOSIS — I480 Paroxysmal atrial fibrillation: Secondary | ICD-10-CM

## 2024-09-14 NOTE — Telephone Encounter (Signed)
 Prescription refill request for Eliquis  received. Indication: A-Fib Last office visit: 06/26/24 Scr: 1.06 06/13/20 Care Everywhere Age: 84 Weight: 79.3 KG Pt has Passed Parameters Dose Good. Overdue Labs

## 2024-09-19 ENCOUNTER — Other Ambulatory Visit: Payer: Self-pay | Admitting: Cardiology

## 2024-09-19 DIAGNOSIS — I480 Paroxysmal atrial fibrillation: Secondary | ICD-10-CM

## 2024-09-19 MED ORDER — APIXABAN 5 MG PO TABS: 5.0000 mg | ORAL_TABLET | Freq: Two times a day (BID) | ORAL | 1 refills | Status: AC

## 2024-11-02 ENCOUNTER — Ambulatory Visit
